# Patient Record
Sex: Male | Born: 1965 | Race: White | Hispanic: No | Marital: Married | Smoking: Never smoker
Health system: Southern US, Community
[De-identification: ages and names within clinical notes are randomized; demographics above are authoritative.]

---

## 2001-07-18 ENCOUNTER — Ambulatory Visit (HOSPITAL_BASED_OUTPATIENT_CLINIC_OR_DEPARTMENT_OTHER): Admission: RE | Admit: 2001-07-18 | Discharge: 2001-07-19 | Payer: Self-pay | Admitting: Orthopedic Surgery

## 2001-09-04 ENCOUNTER — Ambulatory Visit (HOSPITAL_BASED_OUTPATIENT_CLINIC_OR_DEPARTMENT_OTHER): Admission: RE | Admit: 2001-09-04 | Discharge: 2001-09-04 | Payer: Self-pay | Admitting: Orthopedic Surgery

## 2006-07-01 ENCOUNTER — Ambulatory Visit: Admission: RE | Admit: 2006-07-01 | Discharge: 2006-07-01 | Payer: Self-pay | Admitting: Family Medicine

## 2015-03-11 ENCOUNTER — Other Ambulatory Visit: Payer: Self-pay | Admitting: Family Medicine

## 2015-03-11 ENCOUNTER — Ambulatory Visit
Admission: RE | Admit: 2015-03-11 | Discharge: 2015-03-11 | Disposition: A | Discharge status: Home / Self Care | Payer: BLUE CROSS/BLUE SHIELD | Source: Ambulatory Visit | Attending: Family Medicine | Admitting: Family Medicine

## 2015-03-11 DIAGNOSIS — R059 Cough, unspecified: Secondary | ICD-10-CM

## 2015-03-11 DIAGNOSIS — R05 Cough: Secondary | ICD-10-CM

## 2015-08-13 ENCOUNTER — Emergency Department (HOSPITAL_COMMUNITY): Payer: BLUE CROSS/BLUE SHIELD

## 2015-08-13 ENCOUNTER — Encounter (HOSPITAL_COMMUNITY): Payer: Self-pay | Admitting: Family Medicine

## 2015-08-13 ENCOUNTER — Emergency Department (HOSPITAL_COMMUNITY)
Admission: EM | Admit: 2015-08-13 | Discharge: 2015-08-13 | Disposition: A | Discharge status: Home / Self Care | Payer: BLUE CROSS/BLUE SHIELD | Attending: Emergency Medicine | Admitting: Emergency Medicine

## 2015-08-13 DIAGNOSIS — F458 Other somatoform disorders: Secondary | ICD-10-CM | POA: Diagnosis not present

## 2015-08-13 DIAGNOSIS — R110 Nausea: Secondary | ICD-10-CM | POA: Diagnosis not present

## 2015-08-13 DIAGNOSIS — R0602 Shortness of breath: Secondary | ICD-10-CM | POA: Insufficient documentation

## 2015-08-13 DIAGNOSIS — F329 Major depressive disorder, single episode, unspecified: Secondary | ICD-10-CM | POA: Diagnosis not present

## 2015-08-13 DIAGNOSIS — R0989 Other specified symptoms and signs involving the circulatory and respiratory systems: Secondary | ICD-10-CM

## 2015-08-13 DIAGNOSIS — Z79899 Other long term (current) drug therapy: Secondary | ICD-10-CM | POA: Diagnosis not present

## 2015-08-13 DIAGNOSIS — R0789 Other chest pain: Secondary | ICD-10-CM | POA: Insufficient documentation

## 2015-08-13 DIAGNOSIS — R61 Generalized hyperhidrosis: Secondary | ICD-10-CM | POA: Insufficient documentation

## 2015-08-13 DIAGNOSIS — R079 Chest pain, unspecified: Secondary | ICD-10-CM | POA: Diagnosis present

## 2015-08-13 LAB — LIPASE, BLOOD: LIPASE: 28 U/L (ref 22–51)

## 2015-08-13 LAB — BASIC METABOLIC PANEL
Anion gap: 10 (ref 5–15)
BUN: 15 mg/dL (ref 6–20)
CHLORIDE: 101 mmol/L (ref 101–111)
CO2: 26 mmol/L (ref 22–32)
Calcium: 9.5 mg/dL (ref 8.9–10.3)
Creatinine, Ser: 0.97 mg/dL (ref 0.61–1.24)
GFR calc Af Amer: >60 mL/min (ref >60)
GFR calc non Af Amer: >60 mL/min (ref >60)
Glucose, Bld: 119 mg/dL — ABNORMAL HIGH (ref 65–99)
POTASSIUM: 4 mmol/L (ref 3.5–5.1)
SODIUM: 137 mmol/L (ref 135–145)

## 2015-08-13 LAB — COMPREHENSIVE METABOLIC PANEL
ALBUMIN: 4 g/dL (ref 3.5–5.0)
ALT: 44 U/L (ref 17–63)
AST: 31 U/L (ref 15–41)
Alkaline Phosphatase: 58 U/L (ref 38–126)
Anion gap: 9 (ref 5–15)
BILIRUBIN TOTAL: 0.6 mg/dL (ref 0.3–1.2)
BUN: 13 mg/dL (ref 6–20)
CHLORIDE: 101 mmol/L (ref 101–111)
CO2: 27 mmol/L (ref 22–32)
Calcium: 9.3 mg/dL (ref 8.9–10.3)
Creatinine, Ser: 1 mg/dL (ref 0.61–1.24)
GFR calc Af Amer: >60 mL/min (ref >60)
GFR calc non Af Amer: >60 mL/min (ref >60)
GLUCOSE: 92 mg/dL (ref 65–99)
POTASSIUM: 3.8 mmol/L (ref 3.5–5.1)
Sodium: 137 mmol/L (ref 135–145)
Total Protein: 6.9 g/dL (ref 6.5–8.1)

## 2015-08-13 LAB — CBC
HEMATOCRIT: 46 % (ref 39.0–52.0)
Hemoglobin: 16 g/dL (ref 13.0–17.0)
MCH: 31.3 pg (ref 26.0–34.0)
MCHC: 34.8 g/dL (ref 30.0–36.0)
MCV: 90 fL (ref 78.0–100.0)
Platelets: 246 10*3/uL (ref 150–400)
RBC: 5.11 MIL/uL (ref 4.22–5.81)
RDW: 12.6 % (ref 11.5–15.5)
WBC: 13.6 10*3/uL — AB (ref 4.0–10.5)

## 2015-08-13 LAB — I-STAT TROPONIN, ED: Troponin i, poc: 0 ng/mL (ref 0.00–0.08)

## 2015-08-13 LAB — TROPONIN I: TROPONIN I: 0.03 ng/mL (ref <0.031)

## 2015-08-13 MED ORDER — OMEPRAZOLE 20 MG PO CPDR: 20.0000 mg | DELAYED_RELEASE_CAPSULE | Freq: Every day | ORAL | Status: DC

## 2015-08-13 MED ORDER — GI COCKTAIL ~~LOC~~
30.0000 mL | Freq: Once | ORAL | Status: AC
  Administered 2015-08-13: 30 mL via ORAL
  Filled 2015-08-13: qty 30

## 2015-08-13 NOTE — Discharge Instructions (Signed)

## 2015-08-13 NOTE — ED Notes (Signed)
Pt. Left with all belongings and refused wheelchair. Discharge instructions were reviewed and all questions were answered.  

## 2015-08-13 NOTE — ED Notes (Signed)
Pt here for tightness in his chest, the feeling of something being stuck in throat, fatigue. sts started yesterday. sts he started a new depression med yesterday.

## 2015-08-13 NOTE — ED Notes (Signed)
MD at bedside. 

## 2015-08-13 NOTE — ED Provider Notes (Signed)
CSN: [id number]     Arrival date & time 08/13/15  1416 History   None    Chief Complaint  Patient presents with  . Chest Pain   Patient is a 49 y.o. male presenting with general illness. The history is provided by the patient. No language interpreter was used.  Illness Location:  Chest Quality:  Tightness Severity:  Moderate Onset quality:  Gradual Timing:  Intermittent Progression:  Unchanged Chronicity:  New Context:  PMHx of depression (not medicated) presenting with chest tightness. Ate brisket and egg biscuit for breakfast yesterday. Gradual onset chest tightness midmorning. Described as sensation of something in throat. Nonradiating. Initially endorsed worsening with exertion but then stated he noted the pain more at rest. Also worse with lying down and with eating. Associated with mild shortness of breath, nausea but no emesis, and diaphoresis. Denies previous history of the same. FHx of MI in 40's.  Associated symptoms: chest pain, nausea and shortness of breath   Associated symptoms: no abdominal pain, no cough, no diarrhea, no fever and no vomiting     History reviewed. No pertinent past medical history. History reviewed. No pertinent past surgical history. History reviewed. No pertinent family history. Social History  Substance Use Topics  . Smoking status: Never Smoker   . Smokeless tobacco: None  . Alcohol Use: Yes    Review of Systems  Constitutional: Positive for diaphoresis. Negative for fever and chills.  Respiratory: Positive for shortness of breath. Negative for cough.   Cardiovascular: Positive for chest pain.  Gastrointestinal: Positive for nausea. Negative for vomiting, abdominal pain, diarrhea and constipation.  Genitourinary: Negative for dysuria, urgency, frequency and hematuria.  All other systems reviewed and are negative.   Allergies  Review of patient's allergies indicates no known allergies.  Home Medications   Prior to Admission medications    Medication Sig Start Date End Date Taking? Authorizing Provider  atorvastatin (LIPITOR) 40 MG tablet Take 40 mg by mouth daily.   Yes Historical Provider, MD  HYDROcodone-acetaminophen (NORCO/VICODIN) 5-325 MG per tablet Take 1 tablet by mouth every 6 (six) hours as needed for moderate pain.   Yes Historical Provider, MD  methylphenidate 36 MG PO CR tablet Take 36 mg by mouth every morning. 08/01/15  Yes Historical Provider, MD  Vortioxetine HBr (TRINTELLIX) 5 MG TABS Take 5 mg by mouth daily.   Yes Historical Provider, MD  omeprazole (PRILOSEC) 20 MG capsule Take 1 capsule (20 mg total) by mouth daily. 08/13/15   Angelina Ok, MD   BP 117/63 mmHg  Pulse 68  Temp(Src) 98.3 F (36.8 C)  Resp 14  Ht 6' (1.829 m)  Wt 182 lb (82.555 kg)  BMI 24.68 kg/m2  SpO2 96%   Physical Exam  Constitutional: He is oriented to person, place, and time. He appears well-developed and well-nourished. No distress.  HENT:  Head: Normocephalic and atraumatic.  Mouth/Throat: No oropharyngeal exudate.  Eyes: Conjunctivae are normal. Pupils are equal, round, and reactive to light.  Neck: Normal range of motion. Neck supple. No tracheal deviation present.  Cardiovascular: Normal rate, regular rhythm and intact distal pulses.   Pulmonary/Chest: Effort normal and breath sounds normal.  Abdominal: Soft. Bowel sounds are normal.  Musculoskeletal: Normal range of motion.  Neurological: He is alert and oriented to person, place, and time.  Skin: Skin is warm and dry. He is not diaphoretic.    ED Course  Procedures   Labs Review Labs Reviewed  BASIC METABOLIC PANEL - Abnormal; Notable for the  following:    Glucose, Bld 119 (*)    All other components within normal limits  CBC - Abnormal; Notable for the following:    WBC 13.6 (*)    All other components within normal limits  LIPASE, BLOOD  TROPONIN I  COMPREHENSIVE METABOLIC PANEL  I-STAT TROPOININ, ED   Imaging Review Dg Chest 2 View  08/13/2015    CLINICAL DATA:  Chest pain and cough for 1 day  EXAM: CHEST  2 VIEW  COMPARISON:  March 11, 2015  FINDINGS: Lungs are clear. Heart size and pulmonary vascularity are normal. No adenopathy. No pneumothorax. No focal bone lesion. Postoperative changes noted in the left humerus.  IMPRESSION: No edema or consolidation.   Electronically Signed   By: Bretta Bang III M.D.   On: 08/13/2015 15:22   I have personally reviewed and evaluated these images and lab results as part of my medical decision-making.   EKG Interpretation   Date/Time:  Wednesday August 13 2015 14:21:23 EDT Ventricular Rate:  99 PR Interval:  144 QRS Duration: 84 QT Interval:  326 QTC Calculation: 418 R Axis:   69 Text Interpretation:  Normal sinus rhythm with sinus arrhythmia Normal ECG  Confirmed by Rubin Payor  MD, Harrold Donath [phone]) on 08/13/2015 3:32:59 PM      MDM  Mr. Gladu is a 49 yo male w/ PMHx of depression (not medicated) presenting with chest tightness. Ate brisket and egg biscuit for breakfast yesterday. Gradual onset chest tightness midmorning. Described as sensation of something in throat. Nonradiating. Initially endorsed worsening with exertion but then stated he noted the pain more at rest. Also worse with lying down and with eating. Associated with mild shortness of breath, nausea but no emesis, and diaphoresis. Denies previous history of the same. FHx of MI in 40's.   Exam above notable for middle-age male lying in stretcher in no acute distress. Afebrile. Heart rate 90s. Normotensive. Not tachypneic. Breathing well on room air and maintaining saturations without supplemental oxygen. Abdomen benign. Oropharynx clear without evidence of edema or erythema or obvious foreign body. Neck supple with full range of motion.  Suspected GI pathology given globus sensation and motion to pain with eating but patient with cardiac risk factors. WBC 13.6. Hemoglobin 16. BMP unremarkable. Delta troponin (-). Two-view chest  x-ray showing no evidence of edema, consolidation, pneumothorax, or obvious mass lodged in throat.  Patient given GI cocktail with near resolution in pain. Given low risk per stratification and negative troponin patient was discharged home in stable condition. Patient will follow-up with his PCP for outpatient stress testing. Provided with her prescription for Prilosec. ED return precautions discussed. Patient understands and agrees with plan and has no further questions concerning this time.   Patient care discussed with and followed by my attending, Dr. Benjiman Core  Final diagnoses:  Chest pain, unspecified chest pain type  Globus sensation    Angelina Ok, MD 08/13/15 2956  Benjiman Core, MD 08/14/15 1332

## 2017-01-05 IMAGING — DX DG CHEST 2V
2 series · 2 of 2 positions shown · non-contrast
Comparison: March 11, 2015

CLINICAL DATA: Chest pain and cough for 1 day

EXAM:
CHEST  2 VIEW

[chest pa]
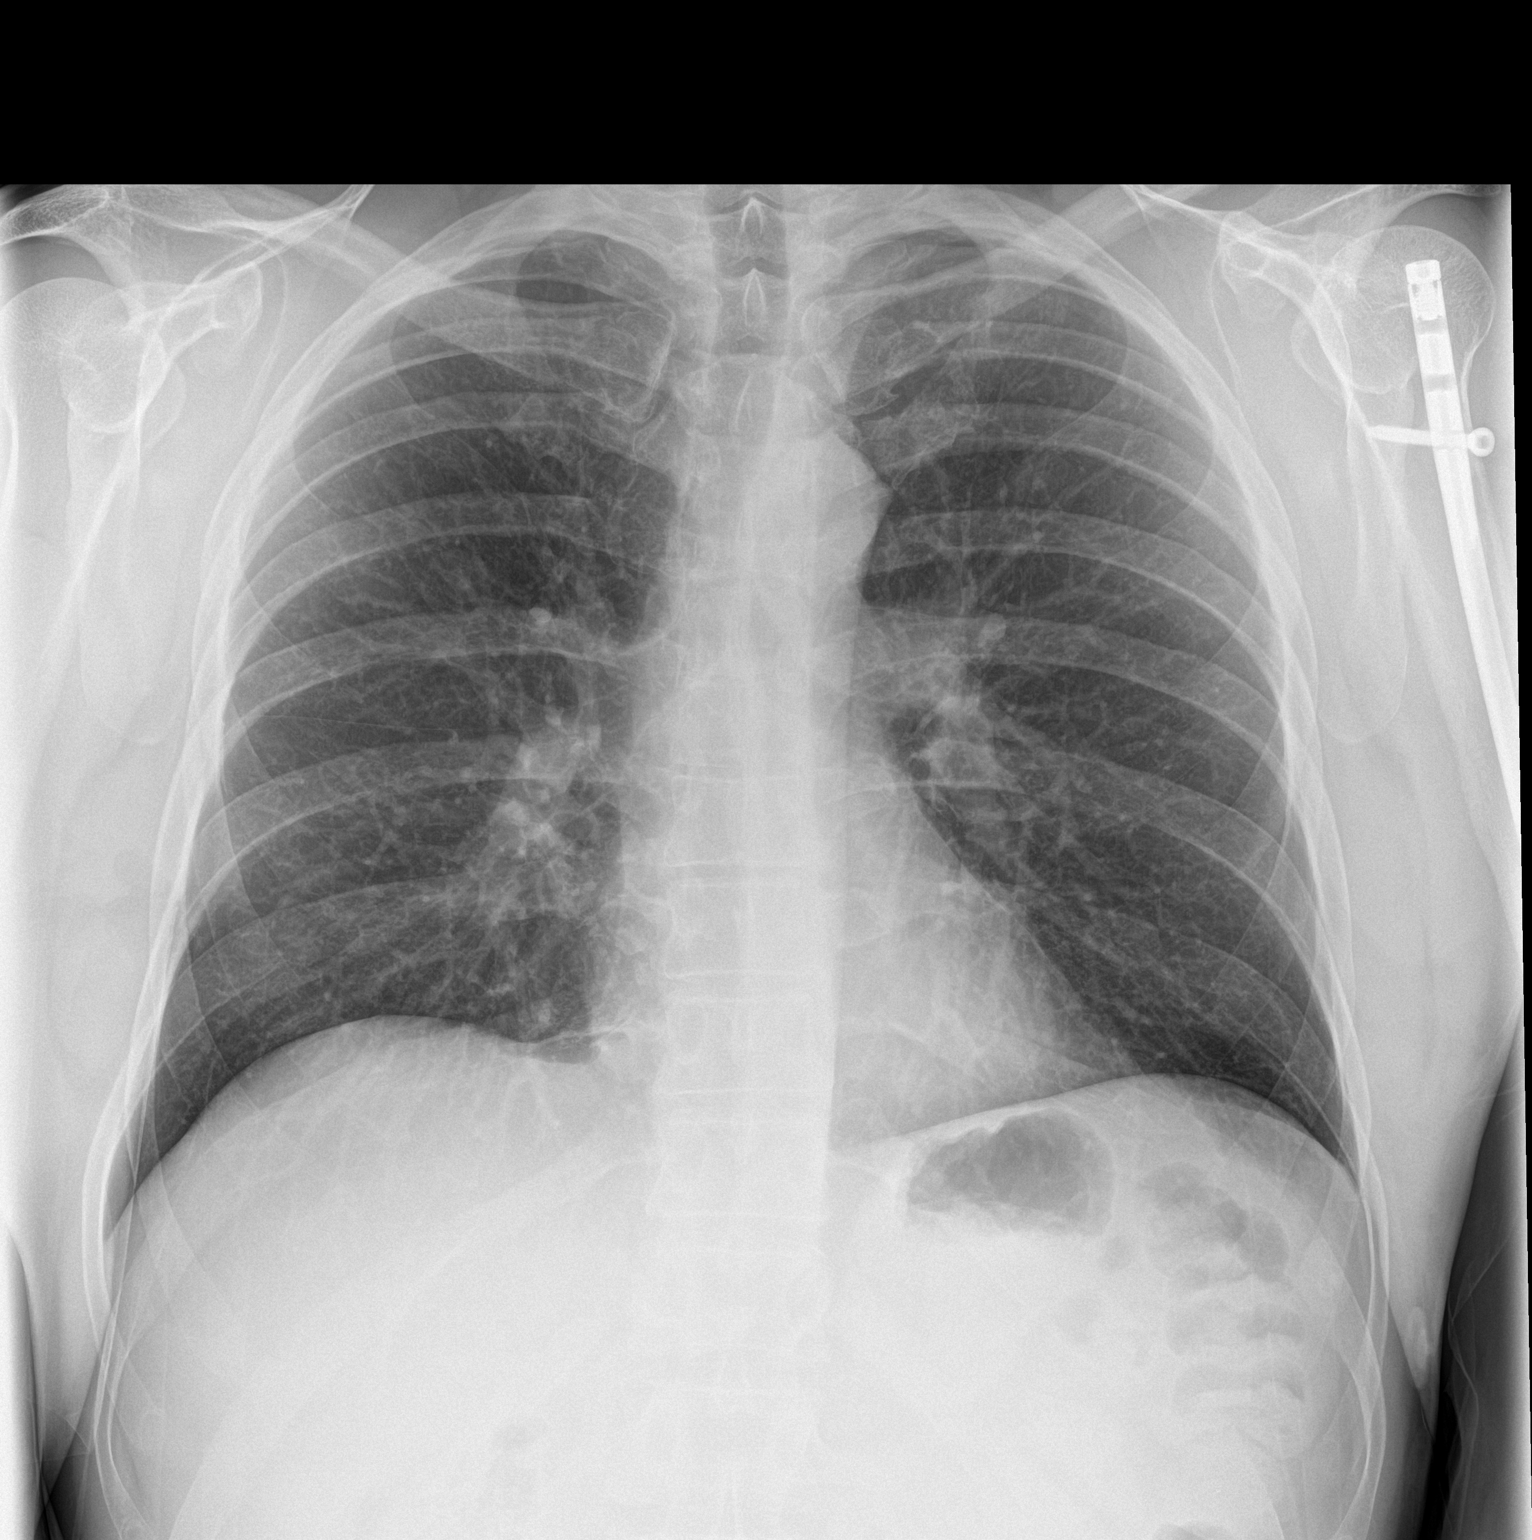

[chest lat]
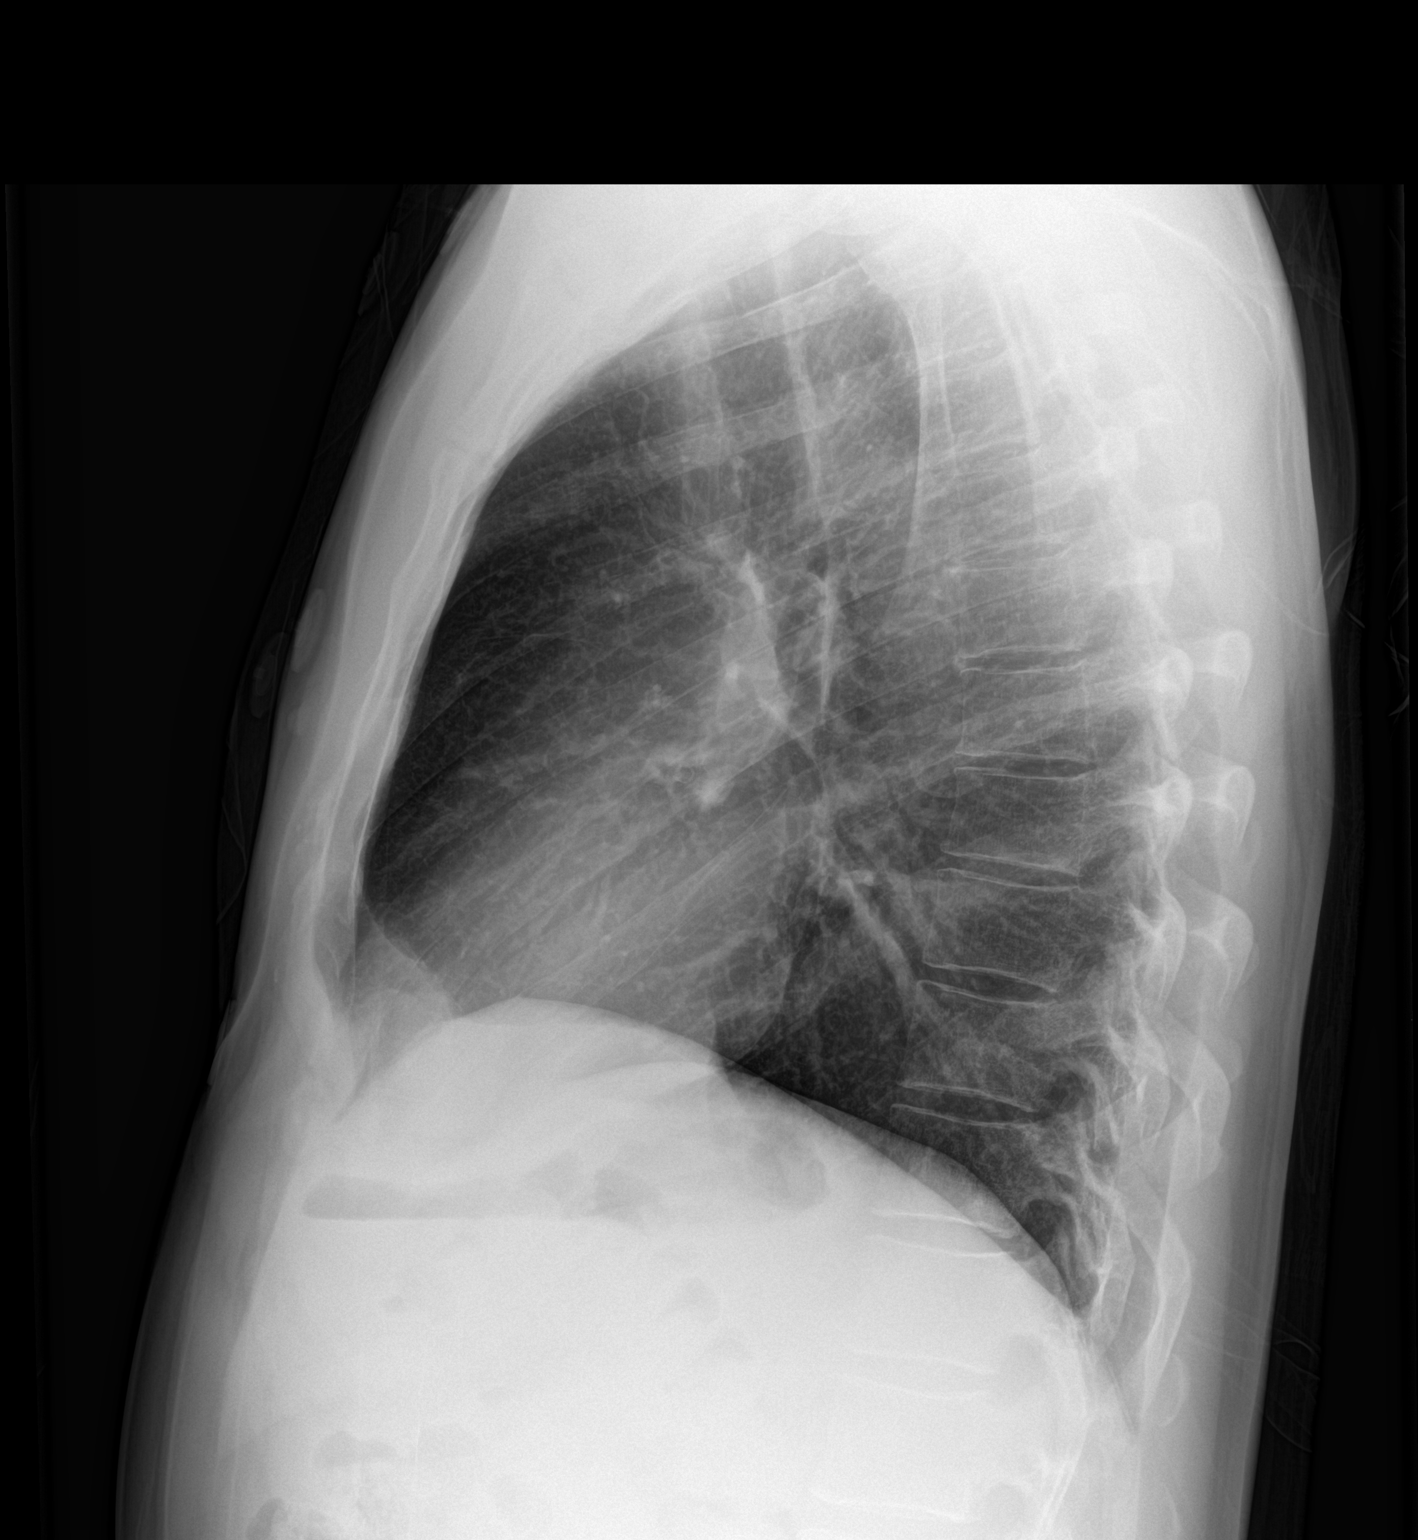

[2 of 2 positions shown; findings below may reference images not displayed]

FINDINGS: Lungs are clear. Heart size and pulmonary vascularity are normal. No
adenopathy. No pneumothorax. No focal bone lesion. Postoperative
changes noted in the left humerus.
IMPRESSION: No edema or consolidation.

## 2018-09-18 ENCOUNTER — Ambulatory Visit: Payer: Self-pay | Admitting: Psychiatry

## 2018-09-18 DIAGNOSIS — F902 Attention-deficit hyperactivity disorder, combined type: Secondary | ICD-10-CM

## 2018-09-18 DIAGNOSIS — R7989 Other specified abnormal findings of blood chemistry: Secondary | ICD-10-CM

## 2018-09-18 DIAGNOSIS — F331 Major depressive disorder, recurrent, moderate: Secondary | ICD-10-CM

## 2018-09-18 DIAGNOSIS — F411 Generalized anxiety disorder: Secondary | ICD-10-CM

## 2018-09-18 MED ORDER — ATOMOXETINE HCL 80 MG PO CAPS: 80.0000 mg | ORAL_CAPSULE | Freq: Every day | ORAL | 1 refills | Status: DC

## 2018-09-18 MED ORDER — ATOMOXETINE HCL 25 MG PO CAPS: ORAL_CAPSULE | ORAL | 0 refills | Status: DC

## 2018-09-18 NOTE — Progress Notes (Signed)
[facility]  Patient ID: Bruce Perez,  MRN: [id number]  PCP: System, Provider Not In  Date of Evaluation: 09/18/2018 Time spent: 40 minutes   HISTORY/CURRENT STATUS: HPI CC as noted Tried provigil for attention and low energy.  Helped a little, some se dizzy.  Asked about Strattera. Concerta less effective than hydrocodone for focus.  Can nap on Provigil.  So overwhelmed in every aspect of life.  Has been irritable felt like being rude to clients but has managed most of the time.  His productivity at work is impaired.  He is feeling more depressed.  Sleep variable not good lately.  A lot of work and rel stress.  Struggling with negative thoughts.  More social anxiety.  Isolating.  Trouble getting things done.  Moved appt up earlier.    Individual Medical History/ Review of Systems: Changes? :Yes Bronchitis 8 weeks until 2 weeks ago.  Some dizzy spells.  Hydromet syrup gave him "laser focus" and productivity.  Also interfered with sleep.  Colonoscopy and prostate exam within last year.  No PSA.  Allergies: Patient has no known allergies.  Current Medications:  Current Outpatient Medications:  .  atorvastatin (LIPITOR) 40 MG tablet, Take 40 mg by mouth daily., Disp: , Rfl:  .  HYDROcodone-acetaminophen (NORCO/VICODIN) 5-325 MG per tablet, Take 1 tablet by mouth every 6 (six) hours as needed for moderate pain., Disp: , Rfl:  .  modafinil (PROVIGIL) 200 MG tablet, Take 200 mg by mouth daily., Disp: , Rfl:  .  methylphenidate 36 MG PO CR tablet, Take 36 mg by mouth every morning., Disp: , Rfl: 0 .  omeprazole (PRILOSEC) 20 MG capsule, Take 1 capsule (20 mg total) by mouth daily. (Patient not taking: Reported on 09/18/2018), Disp: 14 capsule, Rfl: 0 Medication Side Effects: None, unless dizzy.  Family Medical/ Social History: Changes? Yes recent business trips with work.  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 PHD  Mentioned strattera.   Caffeine: 2  cups. Alcohol: 3-4/day.  MENTAL HEALTH EXAM:  There were no vitals taken for this visit.There is no height or weight on file to calculate BMI.  General Appearance: Meticulous  Eye Contact:  Good  Speech:  Normal Rate and talkative  Volume:  Normal  Mood:  Anxious, Depressed and Dysphoric, irritable daily.  Affect:  Appropriate  Thought Process:  Goal Directed  Orientation:  Full (Time, Place, and Person)  Thought Content: WDL   Suicidal Thoughts:  No  Homicidal Thoughts:  No  Memory:  Recent  Judgement:  Fair  Insight:  Fair  Psychomotor Activity:  Normal  Concentration:  Concentration: Good  Recall:  Good  Fund of Knowledge: Good  Language: Good  Akathisia:  No  AIMS (if indicated): not done  Assets:  Communication Skills Desire for Improvement Financial Resources/Insurance Housing Vocational/Educational  ADL's:  Intact  Cognition: WNL  Prognosis:  Fair    DIAGNOSES:    ICD-10-CM   1. Major depressive disorder, recurrent episode, moderate (HCC) F33.1   2. Attention deficit hyperactivity disorder (ADHD), combined type F90.2   3. Generalized anxiety disorder F41.1     RECOMMENDATIONS:  Greater than 50% of face to face time with patient was spent on counseling and coordination of care. We discussed stress affecting focus and vice versa.  His ADD has not been responsive to typical stimulants whether methylphenidate or amphetamine based.  Likewise with Provigil.  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  They also affect his relationships and outside life.  He is having some increase in depression as a result of the combination of these factors.  He asked about trying Strattera.  We just discussed possible medical factors that could contribute to lack of response including low vitamin D and low testosterone.  He is never been checked for either of these.  Therefore we will check these levels.  Discussed if he does have  low testosterone that if he also has latent prostate cancer the testosterone would cause or could cause the cancer to grow.  Because the functional impairment he is interested in the test and testosterone supplementation if indicated.  Also discussed cardiovascular risk..  Discussed alternatives with different mechanisms of action for attention and focus.  Strattera, off-label lithium low dose, Namenda, Aricept.  He would like to pursue the least mechanism or number of mechanisms and number of medicines possible so we will start with Strattera at 25 mg and increase every 5-7 days to 75 mg with a refill of 80 mg.  Follow-up 6 weeks     Lauraine Rinne, MD

## 2018-10-22 ENCOUNTER — Encounter: Payer: Self-pay | Admitting: Emergency Medicine

## 2018-10-22 DIAGNOSIS — F418 Other specified anxiety disorders: Secondary | ICD-10-CM | POA: Insufficient documentation

## 2018-10-22 DIAGNOSIS — F909 Attention-deficit hyperactivity disorder, unspecified type: Secondary | ICD-10-CM

## 2018-11-07 ENCOUNTER — Ambulatory Visit: Payer: Self-pay | Admitting: Psychiatry

## 2018-11-16 ENCOUNTER — Encounter: Payer: Self-pay | Admitting: Psychiatry

## 2018-11-16 ENCOUNTER — Ambulatory Visit: Payer: Self-pay | Admitting: Psychiatry

## 2018-11-16 ENCOUNTER — Other Ambulatory Visit: Payer: Self-pay

## 2018-12-15 ENCOUNTER — Telehealth: Payer: Self-pay | Admitting: Psychiatry

## 2018-12-15 NOTE — Telephone Encounter (Signed)
Needs rx for Atomoxetine 80mg  sent in to [facility].

## 2018-12-15 NOTE — Telephone Encounter (Signed)
Pt has cancelled his past 2 office visits with provider, needs to schedule appt.

## 2018-12-18 ENCOUNTER — Other Ambulatory Visit: Payer: Self-pay

## 2018-12-18 MED ORDER — ATOMOXETINE HCL 25 MG PO CAPS: ORAL_CAPSULE | ORAL | 0 refills | Status: DC

## 2018-12-18 NOTE — Telephone Encounter (Signed)
Sent Rx to [facility]

## 2018-12-18 NOTE — Telephone Encounter (Signed)
Can he get rX filled now. He made appt for Jan 16. See previous 1/10 Tel Call

## 2018-12-21 ENCOUNTER — Encounter: Payer: Self-pay | Admitting: Psychiatry

## 2018-12-21 NOTE — Progress Notes (Signed)
Previous Psych Trials include: Wellbutrin, Zoloft, Fluoxetine, Buspar, Trintellix, Ambien and Vyvanse.

## 2018-12-28 NOTE — Progress Notes (Signed)
Patient checked in but then needed to leave bc of the wait time. Meredith Staggers, MD, DFAPA

## 2019-01-12 NOTE — Progress Notes (Signed)
This encounter was created in error - please disregard.

## 2019-02-05 ENCOUNTER — Other Ambulatory Visit: Payer: Self-pay

## 2019-02-05 ENCOUNTER — Telehealth: Payer: Self-pay | Admitting: Psychiatry

## 2019-02-05 MED ORDER — ATOMOXETINE HCL 80 MG PO CAPS: 80.0000 mg | ORAL_CAPSULE | Freq: Every day | ORAL | 0 refills | Status: DC

## 2019-02-05 NOTE — Telephone Encounter (Signed)
Bruce Perez called to request a refill on his Strattera.  He is out today.  Send to [facility].  No appt but was seen 12/21/18

## 2019-02-05 NOTE — Telephone Encounter (Signed)
Pt overdue for follow up with provider, came in January but didn't stay for office visit. Needs scheduled.

## 2019-02-26 ENCOUNTER — Other Ambulatory Visit: Payer: Self-pay | Admitting: Psychiatry

## 2019-02-26 ENCOUNTER — Telehealth: Payer: Self-pay | Admitting: Psychiatry

## 2019-02-26 MED ORDER — ATOMOXETINE HCL 80 MG PO CAPS: 80.0000 mg | ORAL_CAPSULE | Freq: Every day | ORAL | 0 refills | Status: DC

## 2019-02-26 NOTE — Telephone Encounter (Signed)
Bruce Perez called to request refill of his Strattera 80 mg.  Please sent to [facility] in [place].  No appt scheduled

## 2019-05-09 ENCOUNTER — Other Ambulatory Visit: Payer: Self-pay

## 2019-05-09 ENCOUNTER — Telehealth: Payer: Self-pay | Admitting: Psychiatry

## 2019-05-09 MED ORDER — ATOMOXETINE HCL 80 MG PO CAPS: 80.0000 mg | ORAL_CAPSULE | Freq: Every day | ORAL | 0 refills | Status: DC

## 2019-05-09 NOTE — Telephone Encounter (Signed)
Pt requesting refill for Strattera 80mg . Fill at the [facility]. Made appt for 7/30

## 2019-05-09 NOTE — Telephone Encounter (Signed)
Submitted per request 

## 2019-06-22 ENCOUNTER — Telehealth: Payer: Self-pay | Admitting: Psychiatry

## 2019-06-22 ENCOUNTER — Other Ambulatory Visit: Payer: Self-pay

## 2019-06-22 MED ORDER — ATOMOXETINE HCL 80 MG PO CAPS: 80.0000 mg | ORAL_CAPSULE | Freq: Every day | ORAL | 0 refills | Status: DC

## 2019-06-22 NOTE — Telephone Encounter (Signed)
Patient need refill on Straterra to be sent to [facility] on [address]. Or [facility] which ever is cheaper.  Patient has appt., 07/31

## 2019-06-22 NOTE — Telephone Encounter (Signed)
rx submitted per request.  Pt needs to keep scheduled appt for further refills

## 2019-07-06 ENCOUNTER — Ambulatory Visit: Payer: Self-pay | Admitting: Psychiatry

## 2019-07-09 ENCOUNTER — Encounter: Payer: Self-pay | Admitting: Psychiatry

## 2019-07-09 ENCOUNTER — Other Ambulatory Visit: Payer: Self-pay

## 2019-07-09 ENCOUNTER — Encounter

## 2019-07-09 ENCOUNTER — Ambulatory Visit (INDEPENDENT_AMBULATORY_CARE_PROVIDER_SITE_OTHER): Payer: Self-pay | Admitting: Psychiatry

## 2019-07-09 DIAGNOSIS — F902 Attention-deficit hyperactivity disorder, combined type: Secondary | ICD-10-CM

## 2019-07-09 DIAGNOSIS — R7989 Other specified abnormal findings of blood chemistry: Secondary | ICD-10-CM

## 2019-07-09 NOTE — Progress Notes (Signed)
Bruce JoyClarence H Stringfellow Perez [id number] 1966/03/28 53 y.o.  Subjective:   Patient ID:  Bruce Perez is a 53 y.o. (DOB 1966/03/28) male.  Chief Complaint:  Chief Complaint  Patient presents with  . Follow-up    Medication Management  . ADHD    Medication Management    HPI  Bruce JoyClarence H Statler Perez presents to the office today for follow-up of ADD and episodic mood problems.  Last seen September 14, 2018.  Started Strattera at the time.  Some dizziness with the Strattera and nausea.  Took it daily since here uhntil the last few days.  Still chronic stress.  Energy is better with ADD meds.  Only thing that makes me task focused is opiates and understands it's not an option  Easily distracted, makes mistakes and it hurts business.  Can be irritable.    Chronically stressed. And easily anxious.    Has girlfriend who's into holistic meds.    Past Psychiatric Medication Trials: Provigil side effects, Strattera, Concerta, Wellbutrin, sertraline, fluoxetine, Trintellix, zolpidem, buspirone, Propranolol, hydroxyzine, MPH,  Adderall, Vyvanse,   Review of Systems:  Review of Systems  Neurological: Negative for tremors and weakness.    Medications: I have reviewed the patient's current medications.  Current Outpatient Medications  Medication Sig Dispense Refill  . atorvastatin (LIPITOR) 40 MG tablet Take 40 mg by mouth daily.    Marland Kitchen. HYDROcodone-acetaminophen (NORCO/VICODIN) 5-325 MG per tablet Take 1 tablet by mouth every 6 (six) hours as needed for moderate pain.    Marland Kitchen. atomoxetine (STRATTERA) 80 MG capsule Take 1 capsule (80 mg total) by mouth daily. (Patient not taking: Reported on 07/09/2019) 30 capsule 0   No current facility-administered medications for this visit.     Medication Side Effects: None  Allergies: No Known Allergies  History reviewed. No pertinent past medical history.  History reviewed. No pertinent family history.  Social History   Socioeconomic History   . Marital status: Single    Spouse name: Not on file  . Number of children: Not on file  . Years of education: Not on file  . Highest education level: Not on file  Occupational History  . Not on file  Social Needs  . Financial resource strain: Not on file  . Food insecurity    Worry: Not on file    Inability: Not on file  . Transportation needs    Medical: Not on file    Non-medical: Not on file  Tobacco Use  . Smoking status: Never Smoker  . Smokeless tobacco: Former Engineer, waterUser  Substance and Sexual Activity  . Alcohol use: Yes  . Drug use: No  . Sexual activity: Not on file  Lifestyle  . Physical activity    Days per week: Not on file    Minutes per session: Not on file  . Stress: Not on file  Relationships  . Social Musicianconnections    Talks on phone: Not on file    Gets together: Not on file    Attends religious service: Not on file    Active member of club or organization: Not on file    Attends meetings of clubs or organizations: Not on file    Relationship status: Not on file  . Intimate partner violence    Fear of current or ex partner: Not on file    Emotionally abused: Not on file    Physically abused: Not on file    Forced sexual activity: Not on file  Other Topics Concern  .  Not on file  Social History Narrative  . Not on file    Past Medical History, Surgical history, Social history, and Family history were reviewed and updated as appropriate.   Please see review of systems for further details on the patient's review from today.   Objective:   Physical Exam:  There were no vitals taken for this visit.  Physical Exam Constitutional:      General: He is not in acute distress.    Appearance: He is well-developed.  Musculoskeletal:        General: No deformity.  Neurological:     Mental Status: He is alert and oriented to person, place, and time.     Coordination: Coordination normal.  Psychiatric:        Attention and Perception: Perception normal. He  is inattentive. He does not perceive auditory or visual hallucinations.        Mood and Affect: Mood normal. Mood is not anxious or depressed. Affect is not labile, blunt, angry or inappropriate.        Speech: Speech normal.        Behavior: Behavior normal.        Thought Content: Thought content normal. Thought content is not paranoid or delusional. Thought content does not include homicidal or suicidal ideation. Thought content does not include homicidal or suicidal plan.        Cognition and Memory: Cognition and memory normal.        Judgment: Judgment normal.     Comments: Insight intact     Lab Review:     Component Value Date/Time   NA 137 08/13/2015 1833   K 3.8 08/13/2015 1833   CL 101 08/13/2015 1833   CO2 27 08/13/2015 1833   GLUCOSE 92 08/13/2015 1833   BUN 13 08/13/2015 1833   CREATININE 1.00 08/13/2015 1833   CALCIUM 9.3 08/13/2015 1833   PROT 6.9 08/13/2015 1833   ALBUMIN 4.0 08/13/2015 1833   AST 31 08/13/2015 1833   ALT 44 08/13/2015 1833   ALKPHOS 58 08/13/2015 1833   BILITOT 0.6 08/13/2015 1833   GFRNONAA >60 08/13/2015 1833   GFRAA >60 08/13/2015 1833       Component Value Date/Time   WBC 13.6 (H) 08/13/2015 1425   RBC 5.11 08/13/2015 1425   HGB 16.0 08/13/2015 1425   HCT 46.0 08/13/2015 1425   PLT 246 08/13/2015 1425   MCV 90.0 08/13/2015 1425   MCH 31.3 08/13/2015 1425   MCHC 34.8 08/13/2015 1425   RDW 12.6 08/13/2015 1425    No results found for: POCLITH, LITHIUM   No results found for: PHENYTOIN, PHENOBARB, VALPROATE, CBMZ   .res Assessment: Plan:    Derius was seen today for follow-up and adhd.  Diagnoses and all orders for this visit:  Attention deficit hyperactivity disorder (ADHD), combined type  Low testosterone in male   Disc sig risk dependence on opiates and especially if he's predisposed genetically to addiction with them bc he feels so much better with opiates.  Option Rachel Moulds, MD consultation for [place] resistant  ADHD.  N-Acetylcysteine (NAC) at [phone] mg daily to help with mild cognitive problems like ADHD .  It can be combined with a B-complex vitamin as the B-12 and folate have been shown to sometimes enhance the effect.  Disc Vitamin D and B possibly also.  Option retry Kris Hartmann.  He doesn't want to start new psych meds.  Disc behavioral management techniques to manage ADHD, structure, less  distracting environment.  FU prn  Meredith Staggersarey Cottle, MD, DFAPA   Please see After Visit Summary for patient specific instructions.  No future appointments.  No orders of the defined types were placed in this encounter.   -------------------------------

## 2019-07-09 NOTE — Patient Instructions (Addendum)
N-Acetylcysteine (NAC) at [phone] mg daily to help with mild cognitive problems like ADHD [email or web address]  It can be combined with a B-complex vitamin as the B-12 and folate have been shown to sometimes enhance the effect.  Also vitamin D at least 4000 units daily for cognitive and potential Covid protection.  Rachel Moulds, MD  ADD expert in [place].

## 2019-08-30 ENCOUNTER — Encounter: Payer: Self-pay | Admitting: Psychiatry

## 2019-08-30 ENCOUNTER — Ambulatory Visit (INDEPENDENT_AMBULATORY_CARE_PROVIDER_SITE_OTHER): Payer: Self-pay | Admitting: Psychiatry

## 2019-08-30 ENCOUNTER — Other Ambulatory Visit: Payer: Self-pay

## 2019-08-30 DIAGNOSIS — R7989 Other specified abnormal findings of blood chemistry: Secondary | ICD-10-CM

## 2019-08-30 DIAGNOSIS — F902 Attention-deficit hyperactivity disorder, combined type: Secondary | ICD-10-CM

## 2019-08-30 DIAGNOSIS — R5382 Chronic fatigue, unspecified: Secondary | ICD-10-CM

## 2019-08-30 DIAGNOSIS — R6882 Decreased libido: Secondary | ICD-10-CM

## 2019-08-30 DIAGNOSIS — F411 Generalized anxiety disorder: Secondary | ICD-10-CM

## 2019-08-30 DIAGNOSIS — F331 Major depressive disorder, recurrent, moderate: Secondary | ICD-10-CM

## 2019-08-30 MED ORDER — AMPHETAMINE-DEXTROAMPHETAMINE 10 MG PO TABS: ORAL_TABLET | ORAL | 0 refills | Status: DC

## 2019-08-30 NOTE — Progress Notes (Signed)
Bruce Perez [id number] 1965/12/12 53 y.o.  Subjective:   Patient ID:  Bruce Perez is a 53 y.o. (DOB 02-Feb-1966) male.  Chief Complaint:  Chief Complaint  Patient presents with  . Follow-up    Medication Management  . ADHD    Medication Management  . Depression    Medication Management    HPI  Bruce Perez presents to the office today for follow-up of ADD and episodic mood problems.  Last seen July 09, 2019.  He was still having cognitive complaints.  He did not want to try another psych med but was interested in trying off label N-acetylcysteine possibly combined with a B complex vitamin for its cognitive potential benefit.  Tried NAC and B complex.  No benefit for energy.  Wants to retry stimulant.  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  Low libido. Wants to test testosterone level.   GF goes to integrative health and asks about alternative med supplements.    Some dizziness with the Strattera and nausea.  Took it daily since here uhntil the last few days.  Still chronic stress.  Energy is better with ADD meds.  Only thing that makes me task focused is opiates and understands it's not an option  Easily distracted, makes mistakes and it hurts business.  Can be irritable.    Chronically stressed. And easily anxious.    Has girlfriend who's into holistic meds.   Past Psychiatric Medication Trials: Provigil side effects, Strattera SE, Concerta, Wellbutrin, sertraline, fluoxetine, Trintellix, zolpidem, buspirone, Propranolol, hydroxyzine, MPH,  Adderall, Vyvanse, modafinil  Review of Systems:  Review of Systems  Constitutional: Positive for fatigue.  Neurological: Negative for tremors and weakness.  Psychiatric/Behavioral: Positive for depression.    Medications: I have reviewed the patient's current medications.  Current Outpatient Medications  Medication Sig Dispense Refill  . Acetylcysteine (NAC) 500 MG CAPS Take  by mouth.    Marland Kitchen atorvastatin (LIPITOR) 40 MG tablet Take 40 mg by mouth daily.    . cholecalciferol (VITAMIN D3) 25 MCG (1000 UT) tablet Take 1,000 Units by mouth daily.    . magnesium 30 MG tablet Take 30 mg by mouth daily.    . Omega-3 Fatty Acids (FISH OIL) 1000 MG CAPS Take by mouth.    Marland Kitchen amphetamine-dextroamphetamine (ADDERALL) 10 MG tablet 1/2 tablet twice for 1 week, then 1 twice daily 60 tablet 0   No current facility-administered medications for this visit.     Medication Side Effects: None  Allergies: No Known Allergies  History reviewed. No pertinent past medical history.  History reviewed. No pertinent family history.  Social History   Socioeconomic History  . Marital status: Single    Spouse name: Not on file  . Number of children: Not on file  . Years of education: Not on file  . Highest education level: Not on file  Occupational History  . Not on file  Social Needs  . Financial resource strain: Not on file  . Food insecurity    Worry: Not on file    Inability: Not on file  . Transportation needs    Medical: Not on file    Non-medical: Not on file  Tobacco Use  . Smoking status: Never Smoker  . Smokeless tobacco: Former Network engineer and Sexual Activity  . Alcohol use: Yes  . Drug use: No  . Sexual activity: Not on file  Lifestyle  . Physical activity    Days per week: Not  on file    Minutes per session: Not on file  . Stress: Not on file  Relationships  . Social Musicianconnections    Talks on phone: Not on file    Gets together: Not on file    Attends religious service: Not on file    Active member of club or organization: Not on file    Attends meetings of clubs or organizations: Not on file    Relationship status: Not on file  . Intimate partner violence    Fear of current or ex partner: Not on file    Emotionally abused: Not on file    Physically abused: Not on file    Forced sexual activity: Not on file  Other Topics Concern  . Not on file   Social History Narrative  . Not on file    Past Medical History, Surgical history, Social history, and Family history were reviewed and updated as appropriate.   Please see review of systems for further details on the patient's review from today.   Objective:   Physical Exam:  There were no vitals taken for this visit.  Physical Exam Neurological:     Mental Status: He is alert and oriented to person, place, and time.     Cranial Nerves: No dysarthria.  Psychiatric:        Attention and Perception: He is inattentive.        Mood and Affect: Mood normal.        Speech: Speech normal.        Behavior: Behavior is cooperative.        Thought Content: Thought content normal. Thought content is not paranoid or delusional. Thought content does not include homicidal or suicidal ideation. Thought content does not include homicidal or suicidal plan.        Cognition and Memory: Cognition and memory normal.        Judgment: Judgment normal.     Comments: Insight fair     Lab Review:     Component Value Date/Time   NA 137 08/13/2015 1833   K 3.8 08/13/2015 1833   CL 101 08/13/2015 1833   CO2 27 08/13/2015 1833   GLUCOSE 92 08/13/2015 1833   BUN 13 08/13/2015 1833   CREATININE 1.00 08/13/2015 1833   CALCIUM 9.3 08/13/2015 1833   PROT 6.9 08/13/2015 1833   ALBUMIN 4.0 08/13/2015 1833   AST 31 08/13/2015 1833   ALT 44 08/13/2015 1833   ALKPHOS 58 08/13/2015 1833   BILITOT 0.6 08/13/2015 1833   GFRNONAA >60 08/13/2015 1833   GFRAA >60 08/13/2015 1833       Component Value Date/Time   WBC 13.6 (H) 08/13/2015 1425   RBC 5.11 08/13/2015 1425   HGB 16.0 08/13/2015 1425   HCT 46.0 08/13/2015 1425   PLT 246 08/13/2015 1425   MCV 90.0 08/13/2015 1425   MCH 31.3 08/13/2015 1425   MCHC 34.8 08/13/2015 1425   RDW 12.6 08/13/2015 1425    No results found for: POCLITH, LITHIUM   No results found for: PHENYTOIN, PHENOBARB, VALPROATE, CBMZ   .res Assessment: Plan:     Bruce Perez was seen today for follow-up, adhd and depression.  Diagnoses and all orders for this visit:  Attention deficit hyperactivity disorder (ADHD), combined type -     amphetamine-dextroamphetamine (ADDERALL) 10 MG tablet; 1/2 tablet twice for 1 week, then 1 twice daily  Low libido -     Testosterone, Free, Direct  Major depressive disorder, recurrent episode,  moderate (HCC) -     Testosterone, Free, Direct  Generalized anxiety disorder  Low vitamin D level  Chronic fatigue -     Testosterone, Free, Direct     Disc sig risk dependence on opiates and especially if he's predisposed genetically to addiction with them bc he feels so much better with opiates.   He is struggling with ADD affecting his work performance more than at the last visit including inattention and conversation as well as difficulty initiating and completing tasks. Option Marisue Brooklyn, MD consultation for [place] resistant ADHD.  Continue N-Acetylcysteine (NAC) at [phone] mg daily to help with mild cognitive problems like ADHD .  It can be combined with a B-complex vitamin as the B-12 and folate have been shown to sometimes enhance the effect.  He may gradually have more benefit with time and especially could have additional benefit when combined with a stimulant which she is interested in doing today.  Disc Vitamin D and B possibly also.  Option retry Clent Demark.  He wants to retry Adderall.  He took it only very briefly years ago during a period of very high stress and therefore is difficult to evaluate its effects.  He is med sensitive so we will start with a low-dose 5 mg twice daily and increase to 10 mg twice daily.  We can go higher if that is ineffective.  Disc behavioral management techniques to manage ADHD, structure, less distracting environment.  FU 6-8weeks  Meredith Staggers, MD, DFAPA   Please see After Visit Summary for patient specific instructions.  No future  appointments.  Orders Placed This Encounter  Procedures  . Testosterone, Free, Direct    -------------------------------

## 2019-10-03 ENCOUNTER — Other Ambulatory Visit: Payer: Self-pay

## 2019-10-03 ENCOUNTER — Telehealth: Payer: Self-pay | Admitting: Psychiatry

## 2019-10-03 DIAGNOSIS — F902 Attention-deficit hyperactivity disorder, combined type: Secondary | ICD-10-CM

## 2019-10-03 MED ORDER — AMPHETAMINE-DEXTROAMPHETAMINE 10 MG PO TABS: ORAL_TABLET | ORAL | 0 refills | Status: DC

## 2019-10-03 NOTE — Telephone Encounter (Signed)
Last refill 08/30/2019

## 2019-10-03 NOTE — Telephone Encounter (Signed)
Patient called and wants a refill on his adderall 10 mg to be sent [facility] at [facility] [place] at [address]

## 2019-10-08 ENCOUNTER — Other Ambulatory Visit: Payer: Self-pay

## 2019-10-08 DIAGNOSIS — Z20822 Contact with and (suspected) exposure to covid-19: Secondary | ICD-10-CM

## 2019-10-09 LAB — NOVEL CORONAVIRUS, NAA: SARS-CoV-2, NAA: NOT DETECTED

## 2019-11-08 ENCOUNTER — Telehealth: Payer: Self-pay | Admitting: Psychiatry

## 2019-11-08 ENCOUNTER — Other Ambulatory Visit: Payer: Self-pay

## 2019-11-08 DIAGNOSIS — F902 Attention-deficit hyperactivity disorder, combined type: Secondary | ICD-10-CM

## 2019-11-08 MED ORDER — AMPHETAMINE-DEXTROAMPHETAMINE 10 MG PO TABS: ORAL_TABLET | ORAL | 0 refills | Status: DC

## 2019-11-08 NOTE — Telephone Encounter (Signed)
Last refill 10/10/2019 Last apt 08/29/2019 due back 6-8 weeks for follow up Pended for approval

## 2019-11-08 NOTE — Telephone Encounter (Signed)
Pt would like a refill on Adderall 10mg . Send to [facility] on [address].

## 2019-11-08 NOTE — Telephone Encounter (Signed)
Call to reschedule

## 2019-12-17 ENCOUNTER — Telehealth: Payer: Self-pay | Admitting: Psychiatry

## 2019-12-17 ENCOUNTER — Other Ambulatory Visit: Payer: Self-pay

## 2019-12-17 DIAGNOSIS — F902 Attention-deficit hyperactivity disorder, combined type: Secondary | ICD-10-CM

## 2019-12-17 NOTE — Telephone Encounter (Signed)
Patient called and said that he needs a refill on his adderall sent into the [facility] on [address]

## 2019-12-17 NOTE — Telephone Encounter (Signed)
Call to RS.  Needs appt

## 2019-12-17 NOTE — Telephone Encounter (Signed)
Last refill 11/09/2019 Pended for Dr. Jennelle Human

## 2019-12-20 ENCOUNTER — Other Ambulatory Visit: Payer: Self-pay | Admitting: Psychiatry

## 2019-12-20 DIAGNOSIS — F902 Attention-deficit hyperactivity disorder, combined type: Secondary | ICD-10-CM

## 2019-12-20 MED ORDER — AMPHETAMINE-DEXTROAMPHETAMINE 10 MG PO TABS: ORAL_TABLET | ORAL | 0 refills | Status: DC

## 2019-12-20 NOTE — Telephone Encounter (Signed)
Pt requesting Adderall refill he stated to fill at the [facility]. He has scheduled an appt for 1/20. He will run out on tomorrow.

## 2019-12-26 ENCOUNTER — Ambulatory Visit: Payer: Self-pay | Admitting: Psychiatry

## 2020-02-05 ENCOUNTER — Other Ambulatory Visit: Payer: Self-pay

## 2020-02-05 ENCOUNTER — Encounter: Payer: Self-pay | Admitting: Psychiatry

## 2020-02-05 ENCOUNTER — Ambulatory Visit (INDEPENDENT_AMBULATORY_CARE_PROVIDER_SITE_OTHER): Payer: Self-pay | Admitting: Psychiatry

## 2020-02-05 DIAGNOSIS — F902 Attention-deficit hyperactivity disorder, combined type: Secondary | ICD-10-CM

## 2020-02-05 MED ORDER — AMPHETAMINE-DEXTROAMPHETAMINE 10 MG PO TABS: ORAL_TABLET | ORAL | 0 refills | Status: DC

## 2020-02-05 NOTE — Progress Notes (Signed)
Bruce Perez [id number] [birth year] 54 y.o.  Subjective:   Patient ID:  Bruce Perez Perez is a 54 y.o. (DOB 1966/06/16) male.  Chief Complaint:  Chief Complaint  Patient presents with  . ADHD  . Follow-up    Anxiety and mood and med change    Depression        Associated symptoms include no fatigue.   Bruce Perez presents to the office today for follow-up of ADD and episodic mood problems.  seen July 09, 2019.  He was still having cognitive complaints.  He did not want to try another psych med but was interested in trying off label N-acetylcysteine possibly combined with a B complex vitamin for its cognitive potential benefit.  Last seen August 30, 2019.  Want to retry stimulant.  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  Low libido. Wants to test testosterone level. He wants to retry Adderall.  He took it only very briefly years ago during a period of very high stress and therefore is difficult to evaluate its effects.  He is med sensitive so we will start with a low-dose 5 mg twice daily and increase to 10 mg twice daily.  We can go higher if that is ineffective.  He has received refills since that time.  Adderall helpful.  Immediately helped focus. Really good.  No SE problems.  Not more irritable nor edgy.  Mood good.  Going through breakup of 2 & 1/2 year relationship. Sleep is OK.  It lasts 6 hours.  GF goes to integrative health and asks about alternative med supplements.    Some dizziness with the Strattera and nausea.  Took it daily since here uhntil the last few days.  Still chronic stress.  Energy is better with ADD meds.  Only thing that makes me task focused is opiates and understands it's not an option  Easily distracted, makes mistakes and it hurts business.  Can be irritable.    Chronically stressed. And easily anxious.    Has girlfriend who's into holistic meds.   Past Psychiatric Medication Trials: Provigil side  effects, Strattera SE, Concerta, Wellbutrin, sertraline, fluoxetine, Trintellix, zolpidem, buspirone, Propranolol, hydroxyzine, MPH,  Adderall, Vyvanse, modafinil Tried NAC and B complex.  No benefit for energy.  Review of Systems:  Review of Systems  Constitutional: Negative for fatigue.  Skin: Positive for rash.  Neurological: Negative for tremors and weakness.  Psychiatric/Behavioral: Positive for depression.    Medications: I have reviewed the patient's current medications.  Current Outpatient Medications  Medication Sig Dispense Refill  . Acetylcysteine (NAC) 500 MG CAPS Take by mouth.    Marland Kitchen amphetamine-dextroamphetamine (ADDERALL) 10 MG tablet take 1 tablet twice daily.  Keep appointment with MD January 20 180 tablet 0  . atorvastatin (LIPITOR) 40 MG tablet Take 40 mg by mouth daily.    . cholecalciferol (VITAMIN D3) 25 MCG (1000 UT) tablet Take 1,000 Units by mouth daily.    . magnesium 30 MG tablet Take 30 mg by mouth daily.    . Omega-3 Fatty Acids (FISH OIL) 1000 MG CAPS Take by mouth.     No current facility-administered medications for this visit.    Medication Side Effects: None  Allergies: No Known Allergies  History reviewed. No pertinent past medical history.  History reviewed. No pertinent family history.  Social History   Socioeconomic History  . Marital status: Single    Spouse name: Not on file  . Number of children: Not on  file  . Years of education: Not on file  . Highest education level: Not on file  Occupational History  . Not on file  Tobacco Use  . Smoking status: Never Smoker  . Smokeless tobacco: Former Network engineer and Sexual Activity  . Alcohol use: Yes  . Drug use: No  . Sexual activity: Not on file  Other Topics Concern  . Not on file  Social History Narrative  . Not on file   Social Determinants of Health   Financial Resource Strain:   . Difficulty of Paying Living Expenses: Not on file  Food Insecurity:   . Worried About  Charity fundraiser in the Last Year: Not on file  . Ran Out of Food in the Last Year: Not on file  Transportation Needs:   . Lack of Transportation (Medical): Not on file  . Lack of Transportation (Non-Medical): Not on file  Physical Activity:   . Days of Exercise per Week: Not on file  . Minutes of Exercise per Session: Not on file  Stress:   . Feeling of Stress : Not on file  Social Connections:   . Frequency of Communication with Friends and Family: Not on file  . Frequency of Social Gatherings with Friends and Family: Not on file  . Attends Religious Services: Not on file  . Active Member of Clubs or Organizations: Not on file  . Attends Archivist Meetings: Not on file  . Marital Status: Not on file  Intimate Partner Violence:   . Fear of Current or Ex-Partner: Not on file  . Emotionally Abused: Not on file  . Physically Abused: Not on file  . Sexually Abused: Not on file    Past Medical History, Surgical history, Social history, and Family history were reviewed and updated as appropriate.   Please see review of systems for further details on the patient's review from today.   Objective:   Physical Exam:  There were no vitals taken for this visit.  Physical Exam Neurological:     Mental Status: He is alert and oriented to person, place, and time.     Cranial Nerves: No dysarthria.  Psychiatric:        Attention and Perception: He is attentive.        Mood and Affect: Mood normal.        Speech: Speech normal.        Behavior: Behavior is cooperative.        Thought Content: Thought content normal. Thought content is not paranoid or delusional. Thought content does not include homicidal or suicidal ideation. Thought content does not include homicidal or suicidal plan.        Cognition and Memory: Cognition and memory normal.        Judgment: Judgment normal.     Comments: Insight fair     Lab Review:     Component Value Date/Time   NA 137 08/13/2015  1833   K 3.8 08/13/2015 1833   CL 101 08/13/2015 1833   CO2 27 08/13/2015 1833   GLUCOSE 92 08/13/2015 1833   BUN 13 08/13/2015 1833   CREATININE 1.00 08/13/2015 1833   CALCIUM 9.3 08/13/2015 1833   PROT 6.9 08/13/2015 1833   ALBUMIN 4.0 08/13/2015 1833   AST 31 08/13/2015 1833   ALT 44 08/13/2015 1833   ALKPHOS 58 08/13/2015 1833   BILITOT 0.6 08/13/2015 1833   GFRNONAA >60 08/13/2015 1833   GFRAA >60 08/13/2015 1833  Component Value Date/Time   WBC 13.6 (H) 08/13/2015 1425   RBC 5.11 08/13/2015 1425   HGB 16.0 08/13/2015 1425   HCT 46.0 08/13/2015 1425   PLT 246 08/13/2015 1425   MCV 90.0 08/13/2015 1425   MCH 31.3 08/13/2015 1425   MCHC 34.8 08/13/2015 1425   RDW 12.6 08/13/2015 1425    No results found for: POCLITH, LITHIUM   No results found for: PHENYTOIN, PHENOBARB, VALPROATE, CBMZ   .res Assessment: Plan:    Concepcion was seen today for adhd and follow-up.  Diagnoses and all orders for this visit:  Attention deficit hyperactivity disorder (ADHD), combined type -     amphetamine-dextroamphetamine (ADDERALL) 10 MG tablet; take 1 tablet twice daily.  Keep appointment with MD January 20  Good response this time on Adderall 10 mg twice daily.  Much better focus and productivity.  No other mood symptoms.  Continue N-Acetylcysteine (NAC) at [id number] mg daily to help with mild cognitive problems like ADHD .  It can be combined with a B-complex vitamin as the B-12 and folate have been shown to sometimes enhance the effect.  He may gradually have more benefit with time and especially could have additional benefit when combined with a stimulant which she is interested in doing today.  Disc Vitamin D and B possibly also.  Option retry Clent Demark.  Disc behavioral management techniques to manage ADHD, structure, less distracting environment.  FU 6 mos  Meredith Staggers, MD, DFAPA   Please see After Visit Summary for patient specific instructions.  No  future appointments.  No orders of the defined types were placed in this encounter.   -------------------------------

## 2020-06-02 ENCOUNTER — Other Ambulatory Visit: Payer: Self-pay

## 2020-06-02 ENCOUNTER — Telehealth: Payer: Self-pay | Admitting: Psychiatry

## 2020-06-02 DIAGNOSIS — F902 Attention-deficit hyperactivity disorder, combined type: Secondary | ICD-10-CM

## 2020-06-02 MED ORDER — AMPHETAMINE-DEXTROAMPHETAMINE 10 MG PO TABS: ORAL_TABLET | ORAL | 0 refills | Status: DC

## 2020-06-02 NOTE — Telephone Encounter (Signed)
Last refill 02/05/20 for 90 day supply. Pended Rx for 90 day #180 for Dr. Jennelle Human to review and send

## 2020-06-02 NOTE — Telephone Encounter (Signed)
Pt would like a refill on Adderall. Please send to [facility] on [address]

## 2020-08-07 ENCOUNTER — Ambulatory Visit: Payer: Self-pay | Admitting: Psychiatry

## 2020-08-08 ENCOUNTER — Telehealth: Payer: Self-pay | Admitting: Adult Health

## 2020-08-08 NOTE — Telephone Encounter (Signed)
Called patient about a referral to [place] for monoclonal antibody therapy.  He notes that he is day 12 of symptom onset. I reviewed with him that he does not meet criteria based on the fact that he is further out than 10 days of therapy.  He verbalized understanding and thanks for my call.  Lillard Anes, NP

## 2020-09-08 ENCOUNTER — Telehealth: Payer: Self-pay | Admitting: Psychiatry

## 2020-09-08 NOTE — Telephone Encounter (Signed)
Pt requesting refill for Adderall @ [facility] [address] on canc list.canc 9/2 had covid.

## 2020-09-09 ENCOUNTER — Other Ambulatory Visit: Payer: Self-pay

## 2020-09-09 DIAGNOSIS — F902 Attention-deficit hyperactivity disorder, combined type: Secondary | ICD-10-CM

## 2020-09-09 NOTE — Telephone Encounter (Signed)
Last refill 06/02/2020 for #180 Pended for Dr. Jennelle Human to send

## 2020-09-10 MED ORDER — AMPHETAMINE-DEXTROAMPHETAMINE 10 MG PO TABS: ORAL_TABLET | ORAL | 0 refills | Status: DC

## 2020-10-23 ENCOUNTER — Telehealth: Payer: Self-pay | Admitting: Psychiatry

## 2021-01-06 ENCOUNTER — Other Ambulatory Visit: Payer: Self-pay | Admitting: Psychiatry

## 2021-01-06 ENCOUNTER — Telehealth: Payer: Self-pay | Admitting: Psychiatry

## 2021-01-06 DIAGNOSIS — F902 Attention-deficit hyperactivity disorder, combined type: Secondary | ICD-10-CM

## 2021-01-06 MED ORDER — AMPHETAMINE-DEXTROAMPHETAMINE 10 MG PO TABS: ORAL_TABLET | ORAL | 0 refills | Status: DC

## 2021-01-06 NOTE — Telephone Encounter (Signed)
Sent!

## 2021-01-06 NOTE — Telephone Encounter (Signed)
Pt requesting Rx for Adderall @ [facility]. [address]

## 2021-01-13 ENCOUNTER — Ambulatory Visit (INDEPENDENT_AMBULATORY_CARE_PROVIDER_SITE_OTHER): Payer: Self-pay | Admitting: Psychiatry

## 2021-01-13 ENCOUNTER — Encounter: Payer: Self-pay | Admitting: Psychiatry

## 2021-01-13 ENCOUNTER — Other Ambulatory Visit: Payer: Self-pay

## 2021-01-13 DIAGNOSIS — F411 Generalized anxiety disorder: Secondary | ICD-10-CM

## 2021-01-13 DIAGNOSIS — F902 Attention-deficit hyperactivity disorder, combined type: Secondary | ICD-10-CM

## 2021-01-13 MED ORDER — AMPHETAMINE-DEXTROAMPHETAMINE 30 MG PO TABS: 15.0000 mg | ORAL_TABLET | Freq: Two times a day (BID) | ORAL | 0 refills | Status: DC

## 2021-01-13 NOTE — Progress Notes (Signed)
Bruce Perez Perez [id number] 1966-08-01 55 y.o.  Subjective:   Patient ID:  Bruce Perez is a 55 y.o. (DOB 03/15/1966) male.  Chief Complaint:  Chief Complaint  Patient presents with  . Follow-up  . ADHD    Depression        Associated symptoms include myalgias.  Associated symptoms include no fatigue.   Bruce Perez presents to the office today for follow-up of ADD and episodic mood problems.  seen July 09, 2019.  He was still having cognitive complaints.  He did not want to try another psych med but was interested in trying off label N-acetylcysteine possibly combined with a B complex vitamin for its cognitive potential benefit.  seen August 30, 2019.  Want to retry stimulant.  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  Low libido. Wants to test testosterone level. He wants to retry Adderall.  He took it only very briefly years ago during a period of very high stress and therefore is difficult to evaluate its effects.  He is med sensitive so we will start with a low-dose 5 mg twice daily and increase to 10 mg twice daily.  We can go higher if that is ineffective.  He has received refills since that time.  02/2020 appointment with the following noted: Adderall helpful.  Immediately helped focus. Really good.  No SE problems.  Not more irritable nor edgy.  Mood good.  Going through breakup of 2 & 1/2 year relationship. Sleep is OK.  It lasts 6 hours. GF goes to integrative health and asks about alternative med supplements.   Some dizziness with the Strattera and nausea.  Took it daily since here uhntil the last few days.  Still chronic stress.  Energy is better with ADD meds.  Only thing that makes me task focused is opiates and understands it's not an option Easily distracted, makes mistakes and it hurts business.  Can be irritable.   Chronically stressed. And easily anxious.   Has girlfriend who's into holistic meds.  Plan: Continue  N-acetylcysteine and Adderall 10 mg twice daily with adequate response  01/13/2021 appointment with the following noted: Covid 07/2020 from GF both ended up on O2  Sick for 3 weeks. Feels his body has adjusted some and some tolerance with Adderall.  High levels energy tend to help his focus.  nOt much difference on days he takes it or not.   Mild SE dizziness .  Tried Adderall 15 and it tended to help a little more. Hydrocodone tends to help focus the most. Adderall lasts 6 hours.   Past Psychiatric Medication Trials: Provigil side effects, Strattera SE, Concerta, Wellbutrin, sertraline, fluoxetine, Trintellix, zolpidem, buspirone, Propranolol, hydroxyzine, MPH,  Adderall, Vyvanse, modafinil Tried NAC and B complex.  No benefit for energy.  Review of Systems:  Review of Systems  Constitutional: Negative for fatigue.  Musculoskeletal: Positive for arthralgias and myalgias.  Neurological: Negative for tremors and weakness.  Psychiatric/Behavioral: Positive for depression.    Medications: I have reviewed the patient's current medications.  Current Outpatient Medications  Medication Sig Dispense Refill  . Acetylcysteine (NAC) 500 MG CAPS Take by mouth.    Marland Kitchen atorvastatin (LIPITOR) 40 MG tablet Take 40 mg by mouth daily.    . cholecalciferol (VITAMIN D3) 25 MCG (1000 UT) tablet Take 1,000 Units by mouth daily.    . magnesium 30 MG tablet Take 30 mg by mouth daily.    . Omega-3 Fatty Acids (FISH OIL) 1000  MG CAPS Take by mouth.    Marland Kitchen amphetamine-dextroamphetamine (ADDERALL) 30 MG tablet Take 0.5 tablets by mouth 2 (two) times daily with a meal. 90 tablet 0   No current facility-administered medications for this visit.    Medication Side Effects: None  Allergies: No Known Allergies  History reviewed. No pertinent past medical history.  History reviewed. No pertinent family history.  Social History   Socioeconomic History  . Marital status: Single    Spouse name: Not on file  .  Number of children: Not on file  . Years of education: Not on file  . Highest education level: Not on file  Occupational History  . Not on file  Tobacco Use  . Smoking status: Never Smoker  . Smokeless tobacco: Former Engineer, water and Sexual Activity  . Alcohol use: Yes  . Drug use: No  . Sexual activity: Not on file  Other Topics Concern  . Not on file  Social History Narrative  . Not on file   Social Determinants of Health   Financial Resource Strain: Not on file  Food Insecurity: Not on file  Transportation Needs: Not on file  Physical Activity: Not on file  Stress: Not on file  Social Connections: Not on file  Intimate Partner Violence: Not on file    Past Medical History, Surgical history, Social history, and Family history were reviewed and updated as appropriate.   Please see review of systems for further details on the patient's review from today.   Objective:   Physical Exam:  There were no vitals taken for this visit.  Physical Exam Neurological:     Mental Status: He is alert and oriented to person, place, and time.     Cranial Nerves: No dysarthria.  Psychiatric:        Attention and Perception: He is attentive.        Mood and Affect: Mood normal.        Speech: Speech normal.        Behavior: Behavior is cooperative.        Thought Content: Thought content normal. Thought content is not paranoid or delusional. Thought content does not include homicidal or suicidal ideation. Thought content does not include homicidal or suicidal plan.        Cognition and Memory: Cognition and memory normal.        Judgment: Judgment normal.     Comments: Insight fair     Lab Review:     Component Value Date/Time   NA 137 08/13/2015 1833   K 3.8 08/13/2015 1833   CL 101 08/13/2015 1833   CO2 27 08/13/2015 1833   GLUCOSE 92 08/13/2015 1833   BUN 13 08/13/2015 1833   CREATININE 1.00 08/13/2015 1833   CALCIUM 9.3 08/13/2015 1833   PROT 6.9 08/13/2015 1833    ALBUMIN 4.0 08/13/2015 1833   AST 31 08/13/2015 1833   ALT 44 08/13/2015 1833   ALKPHOS 58 08/13/2015 1833   BILITOT 0.6 08/13/2015 1833   GFRNONAA >60 08/13/2015 1833   GFRAA >60 08/13/2015 1833       Component Value Date/Time   WBC 13.6 (H) 08/13/2015 1425   RBC 5.11 08/13/2015 1425   HGB 16.0 08/13/2015 1425   HCT 46.0 08/13/2015 1425   PLT 246 08/13/2015 1425   MCV 90.0 08/13/2015 1425   MCH 31.3 08/13/2015 1425   MCHC 34.8 08/13/2015 1425   RDW 12.6 08/13/2015 1425    No results found for: POCLITH,  LITHIUM   No results found for: PHENYTOIN, PHENOBARB, VALPROATE, CBMZ   .res Assessment: Plan:    Javeion was seen today for follow-up and adhd.  Diagnoses and all orders for this visit:  Attention deficit hyperactivity disorder (ADHD), combined type -     amphetamine-dextroamphetamine (ADDERALL) 30 MG tablet; Take 0.5 tablets by mouth 2 (two) times daily with a meal.  Generalized anxiety disorder  Less response this time on Adderall 10 mg twice daily. He's on low dose so increase to 15 mg BID.  No other mood symptoms.  He stopped NAC.  Disc Vitamin D and B possibly also.  Option retry Clent Demark.  Disc suspect he's at risk of opiate dependence bc of enhanced mood and energy from them.  Disc behavioral management techniques to manage ADHD, structure, less distracting environment.  FU 6 mos  Meredith Staggers, MD, DFAPA   Please see After Visit Summary for patient specific instructions.  No future appointments.  No orders of the defined types were placed in this encounter.   -------------------------------

## 2021-04-27 ENCOUNTER — Other Ambulatory Visit: Payer: Self-pay

## 2021-04-27 ENCOUNTER — Other Ambulatory Visit: Payer: Self-pay | Admitting: Psychiatry

## 2021-04-27 ENCOUNTER — Telehealth: Payer: Self-pay | Admitting: Psychiatry

## 2021-04-27 DIAGNOSIS — F902 Attention-deficit hyperactivity disorder, combined type: Secondary | ICD-10-CM

## 2021-04-27 MED ORDER — AMPHETAMINE-DEXTROAMPHETAMINE 30 MG PO TABS: 15.0000 mg | ORAL_TABLET | Freq: Two times a day (BID) | ORAL | 0 refills | Status: DC

## 2021-04-27 NOTE — Telephone Encounter (Signed)
Gurtaj called in for refill for Adderall 30mg . States that he only has a two day supply. Would like refill sent to [facility] [address] [place], [place].

## 2021-04-27 NOTE — Telephone Encounter (Signed)
Pended Rx for Dr. Jennelle Human to review and sign. 90 day supply

## 2022-09-27 ENCOUNTER — Ambulatory Visit (INDEPENDENT_AMBULATORY_CARE_PROVIDER_SITE_OTHER): Payer: Self-pay | Admitting: Psychiatry

## 2022-09-27 ENCOUNTER — Encounter: Payer: Self-pay | Admitting: Psychiatry

## 2022-09-27 DIAGNOSIS — F411 Generalized anxiety disorder: Secondary | ICD-10-CM

## 2022-09-27 DIAGNOSIS — F902 Attention-deficit hyperactivity disorder, combined type: Secondary | ICD-10-CM

## 2022-09-27 MED ORDER — AMPHETAMINE-DEXTROAMPHETAMINE 10 MG PO TABS: ORAL_TABLET | ORAL | 0 refills | Status: DC

## 2022-09-27 MED ORDER — AMPHETAMINE-DEXTROAMPHET ER 20 MG PO CP24: 20.0000 mg | ORAL_CAPSULE | Freq: Every day | ORAL | 0 refills | Status: DC

## 2022-09-27 NOTE — Progress Notes (Signed)
Bruce Perez [id number] 09-Sep-1966 56 y.o.  Subjective:   Patient ID:  Bruce Perez is a 56 y.o. (DOB 01-04-1966) male.  Chief Complaint:  Chief Complaint  Patient presents with   Follow-up   ADHD   Depression   Fatigue    Depression        Associated symptoms include no fatigue and no myalgias.   [name] presents to the office today for follow-up of ADD and episodic mood problems.  seen July 09, 2019.  He was still having cognitive complaints.  He did not want to try another psych med but was interested in trying off label N-acetylcysteine possibly combined with a B complex vitamin for its cognitive potential benefit.  seen August 30, 2019.  Want to retry stimulant.  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  Low libido. Wants to test testosterone level. He wants to retry Adderall.  He took it only very briefly years ago during a period of very high stress and therefore is difficult to evaluate its effects.  He is med sensitive so we will start with a low-dose 5 mg twice daily and increase to 10 mg twice daily.  We can go higher if that is ineffective.  He has received refills since that time.  02/2020 appointment with the following noted: Adderall helpful.  Immediately helped focus. Really good.  No SE problems.  Not more irritable nor edgy.  Mood good.  Going through breakup of 2 & 1/2 year relationship. Sleep is OK.  It lasts 6 hours. GF goes to integrative health and asks about alternative med supplements.   Some dizziness with the Strattera and nausea.  Took it daily since here uhntil the last few days.  Still chronic stress.  Energy is better with ADD meds.  Only thing that makes me task focused is opiates and understands it's not an option Easily distracted, makes mistakes and it hurts business.  Can be irritable.   Chronically stressed. And easily anxious.   Has girlfriend who's into holistic meds.  Plan: Continue  N-acetylcysteine and Adderall 10 mg twice daily with adequate response  01/13/2021 appointment with the following noted: Covid 07/2020 from GF both ended up on O2  Sick for 3 weeks. Feels his body has adjusted some and some tolerance with Adderall.  High levels energy tend to help his focus.  nOt much difference on days he takes it or not.   Mild SE dizziness .  Tried Adderall 15 and it tended to help a little more. Hydrocodone tends to help focus the most. Adderall lasts 6 hours.  09/27/22 appt noted: Testosterone replacement didn't solve energy problems but does have normal levels.  Feels he needs to stay on stimulant. Wants to resume Adderall  but maybe consider XR.  Can get benefit from up to 6 hours.  2 evenings per week works late.   Travelling to conference tomorrow.   Mood and anxiety are OK overall.  No irritability. Married a year ago. Met a great girl at [employer].   Health good.   Normal BP.   Past Psychiatric Medication Trials: Provigil side effects, Strattera SE, Concerta, Wellbutrin, sertraline, fluoxetine, Trintellix, zolpidem, buspirone, Propranolol, hydroxyzine, MPH,   Adderall, Vyvanse, modafinil Tried NAC and B complex.  No benefit for energy.  Review of Systems:  Review of Systems  Constitutional:  Negative for fatigue.  Musculoskeletal:  Positive for arthralgias. Negative for myalgias.  Neurological:  Negative for tremors and weakness.  Medications: I have reviewed the patient's current medications.  Current Outpatient Medications  Medication Sig Dispense Refill   amphetamine-dextroamphetamine (ADDERALL XR) 20 MG 24 hr capsule Take 1 capsule (20 mg total) by mouth daily. 30 capsule 0   amphetamine-dextroamphetamine (ADDERALL) 10 MG tablet 1 at 4PM  if needed for long work days. 30 tablet 0   atorvastatin (LIPITOR) 40 MG tablet Take 40 mg by mouth daily.     cholecalciferol (VITAMIN D3) 25 MCG (1000 UT) tablet Take 1,000 Units by mouth daily.     magnesium 30 MG  tablet Take 30 mg by mouth daily.     Omega-3 Fatty Acids (FISH OIL) 1000 MG CAPS Take by mouth.     Acetylcysteine (NAC) 500 MG CAPS Take by mouth. (Patient not taking: Reported on 09/27/2022)     No current facility-administered medications for this visit.    Medication Side Effects: None  Allergies: No Known Allergies  History reviewed. No pertinent past medical history.  History reviewed. No pertinent family history.  Social History   Socioeconomic History   Marital status: Single    Spouse name: Not on file   Number of children: Not on file   Years of education: Not on file   Highest education level: Not on file  Occupational History   Not on file  Tobacco Use   Smoking status: Never   Smokeless tobacco: Former  Substance and Sexual Activity   Alcohol use: Yes   Drug use: No   Sexual activity: Not on file  Other Topics Concern   Not on file  Social History Narrative   Not on file   Social Determinants of Health   Financial Resource Strain: Not on file  Food Insecurity: Not on file  Transportation Needs: Not on file  Physical Activity: Not on file  Stress: Not on file  Social Connections: Not on file  Intimate Partner Violence: Not on file    Past Medical History, Surgical history, Social history, and Family history were reviewed and updated as appropriate.   Please see review of systems for further details on the patient's review from today.   Objective:   Physical Exam:  There were no vitals taken for this visit.  Physical Exam Neurological:     Mental Status: He is alert and oriented to person, place, and time.     Cranial Nerves: No dysarthria.  Psychiatric:        Attention and Perception: He is attentive.        Mood and Affect: Mood normal.        Speech: Speech normal.        Behavior: Behavior is cooperative.        Thought Content: Thought content normal. Thought content is not paranoid or delusional. Thought content does not include  homicidal or suicidal ideation. Thought content does not include suicidal plan.        Cognition and Memory: Cognition and memory normal.        Judgment: Judgment normal.     Comments: Insight fair     Lab Review:     Component Value Date/Time   NA 137 08/13/2015 1833   K 3.8 08/13/2015 1833   CL 101 08/13/2015 1833   CO2 27 08/13/2015 1833   GLUCOSE 92 08/13/2015 1833   BUN 13 08/13/2015 1833   CREATININE 1.00 08/13/2015 1833   CALCIUM 9.3 08/13/2015 1833   PROT 6.9 08/13/2015 1833   ALBUMIN 4.0 08/13/2015 1833  AST 31 08/13/2015 1833   ALT 44 08/13/2015 1833   ALKPHOS 58 08/13/2015 1833   BILITOT 0.6 08/13/2015 1833   GFRNONAA >60 08/13/2015 1833   GFRAA >60 08/13/2015 1833       Component Value Date/Time   WBC 13.6 (H) 08/13/2015 1425   RBC 5.11 08/13/2015 1425   HGB 16.0 08/13/2015 1425   HCT 46.0 08/13/2015 1425   PLT 246 08/13/2015 1425   MCV 90.0 08/13/2015 1425   MCH 31.3 08/13/2015 1425   MCHC 34.8 08/13/2015 1425   RDW 12.6 08/13/2015 1425    No results found for: "POCLITH", "LITHIUM"   No results found for: "PHENYTOIN", "PHENOBARB", "VALPROATE", "CBMZ"   .res Assessment: Plan:    Kendon was seen today for follow-up, adhd, depression and fatigue.  Diagnoses and all orders for this visit:  Attention deficit hyperactivity disorder (ADHD), combined type -     amphetamine-dextroamphetamine (ADDERALL XR) 20 MG 24 hr capsule; Take 1 capsule (20 mg total) by mouth daily. -     amphetamine-dextroamphetamine (ADDERALL) 10 MG tablet; 1 at 4PM  if needed for long work days.  Generalized anxiety disorder  He has been on Adderall 15 mg twice daily with good response in the past.  He stopped it.  He had low testosterone levels and decided to pursue treatment with that and they have been corrected but his energy and focus is still not normal and not as good as when he was on Adderall.  He wants to resume Adderall.  Otherwise he is healthy.  Depression and  anxiety are under good control.  He is interested in trying the XR version  Adderall XR 20 mg every morning, Adderall 10 mg every afternoon on long work days.  He stopped NAC.  Disc Vitamin D and B possibly also.  Option retry Kris Hartmann or others.  Off opiates.  Hx feeling energized on them.  Disc behavioral management techniques to manage ADHD, structure, less distracting environment.  FU 6 mos  Lynder Parents, MD, DFAPA   Please see After Visit Summary for patient specific instructions.  No future appointments.  No orders of the defined types were placed in this encounter.   -------------------------------

## 2022-10-22 ENCOUNTER — Telehealth: Payer: Self-pay | Admitting: Psychiatry

## 2022-10-22 NOTE — Telephone Encounter (Signed)
Last filled 11/23, due 11/20

## 2022-10-22 NOTE — Telephone Encounter (Signed)
Pt called and asked for a refill on his adderall xr 20 mg. Pharmacy is [facility] [address] in [place]

## 2022-10-25 ENCOUNTER — Other Ambulatory Visit: Payer: Self-pay

## 2022-10-25 DIAGNOSIS — F902 Attention-deficit hyperactivity disorder, combined type: Secondary | ICD-10-CM

## 2022-10-25 NOTE — Telephone Encounter (Signed)
Pended.

## 2022-10-26 MED ORDER — AMPHETAMINE-DEXTROAMPHET ER 20 MG PO CP24: 20.0000 mg | ORAL_CAPSULE | Freq: Every day | ORAL | 0 refills | Status: DC

## 2023-01-03 ENCOUNTER — Telehealth: Payer: Self-pay | Admitting: Psychiatry

## 2023-01-03 NOTE — Telephone Encounter (Signed)
Awaiting pharmacy.

## 2023-01-03 NOTE — Telephone Encounter (Signed)
Bruce Perez called and LM at 9:24 for refill of his Adderall.  I called him back to verify which one. He said the slow release needs refill.  Send to [facility] on [address] in [place].  Has appt 03/07/23.  He will call [facility] to make sure they have it.  He feels they always has but will let us know if they have it.  He will be out in 2 days.

## 2023-01-04 ENCOUNTER — Other Ambulatory Visit: Payer: Self-pay

## 2023-01-31 ENCOUNTER — Telehealth: Payer: Self-pay | Admitting: Psychiatry

## 2023-01-31 ENCOUNTER — Other Ambulatory Visit: Payer: Self-pay

## 2023-01-31 DIAGNOSIS — F902 Attention-deficit hyperactivity disorder, combined type: Secondary | ICD-10-CM

## 2023-01-31 MED ORDER — AMPHETAMINE-DEXTROAMPHET ER 20 MG PO CP24: 20.0000 mg | ORAL_CAPSULE | Freq: Every day | ORAL | 0 refills | Status: DC

## 2023-01-31 NOTE — Telephone Encounter (Signed)
Pended.

## 2023-01-31 NOTE — Telephone Encounter (Signed)
Patient lvm for refill on Adderall '20mg'$ . Ph: [facility]

## 2023-03-03 ENCOUNTER — Telehealth: Payer: Self-pay | Admitting: Psychiatry

## 2023-03-03 ENCOUNTER — Other Ambulatory Visit: Payer: Self-pay | Admitting: Psychiatry

## 2023-03-03 DIAGNOSIS — F902 Attention-deficit hyperactivity disorder, combined type: Secondary | ICD-10-CM

## 2023-03-03 MED ORDER — AMPHETAMINE-DEXTROAMPHET ER 20 MG PO CP24: 20.0000 mg | ORAL_CAPSULE | Freq: Every day | ORAL | 0 refills | Status: DC

## 2023-03-03 NOTE — Telephone Encounter (Signed)
Pt called at 10:22a to request refill of Adderall  to  [id number] - [address], [place] [address] Phone: [phone]  Fax: [phone]   It is available there.  Next appt 4/1

## 2023-03-07 ENCOUNTER — Encounter: Payer: Self-pay | Admitting: Psychiatry

## 2023-03-07 ENCOUNTER — Ambulatory Visit (INDEPENDENT_AMBULATORY_CARE_PROVIDER_SITE_OTHER): Payer: Self-pay | Admitting: Psychiatry

## 2023-03-07 VITALS — BP 138/97 | HR 79

## 2023-03-07 DIAGNOSIS — F411 Generalized anxiety disorder: Secondary | ICD-10-CM

## 2023-03-07 DIAGNOSIS — F902 Attention-deficit hyperactivity disorder, combined type: Secondary | ICD-10-CM

## 2023-03-07 MED ORDER — AMPHETAMINE-DEXTROAMPHET ER 20 MG PO CP24: 20.0000 mg | ORAL_CAPSULE | Freq: Every day | ORAL | 0 refills | Status: DC

## 2023-03-07 MED ORDER — AMPHETAMINE-DEXTROAMPHETAMINE 10 MG PO TABS: ORAL_TABLET | ORAL | 0 refills | Status: DC

## 2023-03-07 NOTE — Progress Notes (Signed)
RYE SNAPE Perez [id number] 1966-09-06 57 y.o.  Subjective:   Patient ID:  Bruce Perez is a 57 y.o. (DOB [birth year]) male.  Chief Complaint:  Chief Complaint  Patient presents with   Follow-up   ADD    Depression        Associated symptoms include no fatigue and no myalgias.   Bruce Perez presents to the office today for follow-up of ADD and episodic mood problems.  seen July 09, 2019.  He was still having cognitive complaints.  He did not want to try another psych med but was interested in trying off label N-acetylcysteine possibly combined with a B complex vitamin for its cognitive potential benefit.  seen August 30, 2019.  Want to retry stimulant.  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  Low libido. Wants to test testosterone level. He wants to retry Adderall.  He took it only very briefly years ago during a period of very high stress and therefore is difficult to evaluate its effects.  He is med sensitive so we will start with a low-dose 5 mg twice daily and increase to 10 mg twice daily.  We can go higher if that is ineffective.  He has received refills since that time.  02/2020 appointment with the following noted: Adderall helpful.  Immediately helped focus. Really good.  No SE problems.  Not more irritable nor edgy.  Mood good.  Going through breakup of 2 & 1/2 year relationship. Sleep is OK.  It lasts 6 hours. GF goes to integrative health and asks about alternative med supplements.   Some dizziness with the Strattera and nausea.  Took it daily since here [place] the last few days.  Still chronic stress.  Energy is better with ADD meds.  Only thing that makes me task focused is opiates and understands it's not an option Easily distracted, makes mistakes and it hurts business.  Can be irritable.   Chronically stressed. And easily anxious.   Has girlfriend who's into holistic meds.  Plan: Continue N-acetylcysteine and Adderall  10 mg twice daily with adequate response  01/13/2021 appointment with the following noted: Covid 07/2020 from GF both ended up on O2  Sick for 3 weeks. Feels his body has adjusted some and some tolerance with Adderall.  High levels energy tend to help his focus.  nOt much difference on days he takes it or not.   Mild SE dizziness .  Tried Adderall 15 and it tended to help a little more. Hydrocodone tends to help focus the most. Adderall lasts 6 hours.  09/27/22 appt noted: Testosterone replacement didn't solve energy problems but does have normal levels.  Feels he needs to stay on stimulant. Wants to resume Adderall  but maybe consider XR.  Can get benefit from up to 6 hours.  2 evenings per week works late.   Travelling to conference tomorrow.   Mood and anxiety are OK overall.  No irritability. Married a year ago. Met a great girl at [employer].   Health good.   Normal BP.  03/07/23 appt : Taking Adderall XR 20 AM and occ Adderall 10 mg. Didn't notice much effect from testosterone after supplementation. Getting good benefit with Adderall.  But still can make mistakes.    No sig SE.   No prolems with mood and anxiety. Appetite and sleep are good.  Did start snoring.   No SE with testosterone .   Past Psychiatric Medication Trials: Provigil side effects, Strattera  SE, Concerta, Wellbutrin, sertraline, fluoxetine, Trintellix, zolpidem, buspirone, Propranolol, hydroxyzine, MPH,   Adderall, Vyvanse, modafinil Tried NAC and B complex.  No benefit for energy.  Review of Systems:  Review of Systems  Constitutional:  Negative for fatigue.  Musculoskeletal:  Positive for arthralgias. Negative for myalgias.  Neurological:  Negative for tremors.    Medications: I have reviewed the patient's current medications.  Current Outpatient Medications  Medication Sig Dispense Refill   Acetylcysteine (NAC) 500 MG CAPS Take by mouth.     amphetamine-dextroamphetamine (ADDERALL XR) 20 MG 24 hr capsule  Take 1 capsule (20 mg total) by mouth daily. 30 capsule 0   atorvastatin (LIPITOR) 40 MG tablet Take 40 mg by mouth daily.     cholecalciferol (VITAMIN D3) 25 MCG (1000 UT) tablet Take 1,000 Units by mouth daily.     magnesium 30 MG tablet Take 30 mg by mouth daily.     Omega-3 Fatty Acids (FISH OIL) 1000 MG CAPS Take by mouth.     [START ON 04/04/2023] amphetamine-dextroamphetamine (ADDERALL XR) 20 MG 24 hr capsule Take 1 capsule (20 mg total) by mouth daily. 30 capsule 0   [START ON 05/02/2023] amphetamine-dextroamphetamine (ADDERALL XR) 20 MG 24 hr capsule Take 1 capsule (20 mg total) by mouth daily. 30 capsule 0   amphetamine-dextroamphetamine (ADDERALL) 10 MG tablet 1 at 4PM  if needed for long work days. 30 tablet 0   No current facility-administered medications for this visit.    Medication Side Effects: None  Allergies: No Known Allergies  History reviewed. No pertinent past medical history.  History reviewed. No pertinent family history.  Social History   Socioeconomic History   Marital status: Married    Spouse name: Not on file   Number of children: Not on file   Years of education: Not on file   Highest education level: Not on file  Occupational History   Not on file  Tobacco Use   Smoking status: Never   Smokeless tobacco: Former  Substance and Sexual Activity   Alcohol use: Yes   Drug use: No   Sexual activity: Not on file  Other Topics Concern   Not on file  Social History Narrative   Not on file   Social Determinants of Health   Financial Resource Strain: Not on file  Food Insecurity: Not on file  Transportation Needs: Not on file  Physical Activity: Not on file  Stress: Not on file  Social Connections: Not on file  Intimate Partner Violence: Not on file    Past Medical History, Surgical history, Social history, and Family history were reviewed and updated as appropriate.   Please see review of systems for further details on the patient's review  from today.   Objective:   Physical Exam:  BP (!) 138/97   Pulse 79   Physical Exam Neurological:     Mental Status: He is alert and oriented to person, place, and time.     Cranial Nerves: No dysarthria.  Psychiatric:        Attention and Perception: He is attentive.        Mood and Affect: Mood normal. Mood is not depressed.        Speech: Speech normal.        Behavior: Behavior is cooperative.        Thought Content: Thought content normal. Thought content is not paranoid or delusional. Thought content does not include homicidal or suicidal ideation. Thought content does not include suicidal plan.  Cognition and Memory: Cognition and memory normal.        Judgment: Judgment normal.     Comments: Insight fair     Lab Review:     Component Value Date/Time   NA 137 08/13/2015 1833   K 3.8 08/13/2015 1833   CL 101 08/13/2015 1833   CO2 27 08/13/2015 1833   GLUCOSE 92 08/13/2015 1833   BUN 13 08/13/2015 1833   CREATININE 1.00 08/13/2015 1833   CALCIUM 9.3 08/13/2015 1833   PROT 6.9 08/13/2015 1833   ALBUMIN 4.0 08/13/2015 1833   AST 31 08/13/2015 1833   ALT 44 08/13/2015 1833   ALKPHOS 58 08/13/2015 1833   BILITOT 0.6 08/13/2015 1833   GFRNONAA >60 08/13/2015 1833   GFRAA >60 08/13/2015 1833       Component Value Date/Time   WBC 13.6 (H) 08/13/2015 1425   RBC 5.11 08/13/2015 1425   HGB 16.0 08/13/2015 1425   HCT 46.0 08/13/2015 1425   PLT 246 08/13/2015 1425   MCV 90.0 08/13/2015 1425   MCH 31.3 08/13/2015 1425   MCHC 34.8 08/13/2015 1425   RDW 12.6 08/13/2015 1425    No results found for: "POCLITH", "LITHIUM"   No results found for: "PHENYTOIN", "PHENOBARB", "VALPROATE", "CBMZ"   .res Assessment: Plan:    Bruce Perez was seen today for follow-up and add.  Diagnoses and all orders for this visit:  Attention deficit hyperactivity disorder (ADHD), combined type -     amphetamine-dextroamphetamine (ADDERALL) 10 MG tablet; 1 at 4PM  if needed for  long work days. -     amphetamine-dextroamphetamine (ADDERALL XR) 20 MG 24 hr capsule; Take 1 capsule (20 mg total) by mouth daily. -     amphetamine-dextroamphetamine (ADDERALL XR) 20 MG 24 hr capsule; Take 1 capsule (20 mg total) by mouth daily.  Generalized anxiety disorder    He had low testosterone levels and decided to pursue treatment with that and they have been corrected but his energy and focus is still not normal and not as good as on Adderall XR 20 mg AM  Otherwise he is healthy.  Depression and anxiety are under good control.    Adderall XR 20 mg every morning, Adderall 10 mg every afternoon on long work days.  Good response XR vs IR  Discussed potential benefits, risks, and side effects of stimulants with patient to include increased heart rate, palpitations, insomnia, increased anxiety, increased irritability, or decreased appetite.  Instructed patient to contact office if experiencing any significant tolerability issues. His BP is not consistetnly high but is a little high today  He stopped NAC.  Disc Vitamin D and B possibly also.  Option retry Kris Hartmann or others.  Off opiates for an extended period.  Hx feeling energized on them.  Disc behavioral management techniques to manage ADHD, structure, less distracting environment.  FU 6 mos  Lynder Parents, MD, DFAPA   Please see After Visit Summary for patient specific instructions.  No future appointments.  No orders of the defined types were placed in this encounter.   -------------------------------

## 2023-04-18 ENCOUNTER — Other Ambulatory Visit (HOSPITAL_BASED_OUTPATIENT_CLINIC_OR_DEPARTMENT_OTHER): Payer: Self-pay | Admitting: Family Medicine

## 2023-04-18 DIAGNOSIS — E782 Mixed hyperlipidemia: Secondary | ICD-10-CM

## 2023-05-03 ENCOUNTER — Ambulatory Visit (HOSPITAL_BASED_OUTPATIENT_CLINIC_OR_DEPARTMENT_OTHER)
Admission: RE | Admit: 2023-05-03 | Discharge: 2023-05-03 | Disposition: A | Discharge status: Home / Self Care | Payer: Self-pay | Source: Ambulatory Visit | Attending: Family Medicine | Admitting: Family Medicine

## 2023-05-03 DIAGNOSIS — E782 Mixed hyperlipidemia: Secondary | ICD-10-CM | POA: Insufficient documentation

## 2023-06-07 ENCOUNTER — Telehealth: Payer: Self-pay | Admitting: Psychiatry

## 2023-06-07 ENCOUNTER — Other Ambulatory Visit: Payer: Self-pay

## 2023-06-07 DIAGNOSIS — F902 Attention-deficit hyperactivity disorder, combined type: Secondary | ICD-10-CM

## 2023-06-07 NOTE — Telephone Encounter (Signed)
Pt called at 11:55a requesting 3 months of refills of Adderall 10 mg and 3 months of Adderal XR 20 mg to  [facility] [id number] - [place], [place] - [address], [place] [address] Phone: [phone]  Fax: [phone]   He is down to one pill.   No upcoming appts scheduled.

## 2023-06-07 NOTE — Telephone Encounter (Signed)
Pended.

## 2023-06-08 MED ORDER — AMPHETAMINE-DEXTROAMPHETAMINE 10 MG PO TABS
ORAL_TABLET | ORAL | 0 refills | Status: DC
Start: 2023-08-02 — End: 2024-06-15

## 2023-06-08 MED ORDER — AMPHETAMINE-DEXTROAMPHETAMINE 10 MG PO TABS
ORAL_TABLET | ORAL | 0 refills | Status: DC
Start: 2023-07-05 — End: 2023-11-15

## 2023-06-08 MED ORDER — AMPHETAMINE-DEXTROAMPHETAMINE 10 MG PO TABS
ORAL_TABLET | ORAL | 0 refills | Status: DC
Start: 2023-06-08 — End: 2024-05-08

## 2023-06-08 MED ORDER — AMPHETAMINE-DEXTROAMPHET ER 20 MG PO CP24
20.0000 mg | ORAL_CAPSULE | Freq: Every day | ORAL | 0 refills | Status: DC
Start: 2023-06-08 — End: 2023-11-15

## 2023-06-08 MED ORDER — AMPHETAMINE-DEXTROAMPHET ER 20 MG PO CP24
20.0000 mg | ORAL_CAPSULE | Freq: Every day | ORAL | 0 refills | Status: DC
Start: 2023-08-02 — End: 2023-09-12

## 2023-06-08 MED ORDER — AMPHETAMINE-DEXTROAMPHET ER 20 MG PO CP24
20.0000 mg | ORAL_CAPSULE | Freq: Every day | ORAL | 0 refills | Status: DC
Start: 2023-07-05 — End: 2023-10-10

## 2023-09-12 ENCOUNTER — Telehealth: Payer: Self-pay | Admitting: Psychiatry

## 2023-09-12 ENCOUNTER — Other Ambulatory Visit: Payer: Self-pay

## 2023-09-12 DIAGNOSIS — F902 Attention-deficit hyperactivity disorder, combined type: Secondary | ICD-10-CM

## 2023-09-12 MED ORDER — AMPHETAMINE-DEXTROAMPHET ER 20 MG PO CP24
20.0000 mg | ORAL_CAPSULE | Freq: Every day | ORAL | 0 refills | Status: DC
Start: 2023-09-12 — End: 2023-10-17

## 2023-09-12 NOTE — Telephone Encounter (Signed)
RF on Adderall Xr20mg  needed. He made a f/up appt for 11/7. Please send in to: [facility] [id number] - [place], [place] - [address]  [address],

## 2023-09-12 NOTE — Telephone Encounter (Signed)
Pended.

## 2023-10-10 ENCOUNTER — Telehealth: Payer: Self-pay | Admitting: Psychiatry

## 2023-10-10 ENCOUNTER — Other Ambulatory Visit: Payer: Self-pay

## 2023-10-10 DIAGNOSIS — F902 Attention-deficit hyperactivity disorder, combined type: Secondary | ICD-10-CM

## 2023-10-10 MED ORDER — AMPHETAMINE-DEXTROAMPHET ER 20 MG PO CP24
20.0000 mg | ORAL_CAPSULE | Freq: Every day | ORAL | 0 refills | Status: DC
Start: 2023-10-10 — End: 2023-12-26

## 2023-10-10 NOTE — Telephone Encounter (Signed)
Bruce Perez called at 11:10 to request refill of her Adderal XR 20mg .  This will be an early refill.  Not due until 11/6 or 7, but [place] and needs to refill before he leaves.  He has a video appt 11/6 but he will on the way to the mountains or already there.  He needs to be able to pick up by 11/5. Send to [facility] [id number] - [place], [place] - [address]

## 2023-10-10 NOTE — Telephone Encounter (Signed)
PENDED

## 2023-10-12 ENCOUNTER — Telehealth: Payer: Self-pay | Admitting: Psychiatry

## 2023-10-17 ENCOUNTER — Other Ambulatory Visit: Payer: Self-pay

## 2023-10-17 ENCOUNTER — Telehealth: Payer: Self-pay | Admitting: Psychiatry

## 2023-10-17 DIAGNOSIS — F902 Attention-deficit hyperactivity disorder, combined type: Secondary | ICD-10-CM

## 2023-10-17 MED ORDER — AMPHETAMINE-DEXTROAMPHET ER 20 MG PO CP24
20.0000 mg | ORAL_CAPSULE | Freq: Every day | ORAL | 0 refills | Status: DC
Start: 2023-10-17 — End: 2024-01-26

## 2023-10-17 NOTE — Telephone Encounter (Signed)
Pended.

## 2023-10-17 NOTE — Telephone Encounter (Signed)
Bruce Perez called to request refill of his Adderall XR 20 mg capsule.  Last RX for was for only #7 because he had an appt 11/6.  But we cancelled his appt because of the water issues.  Next appt available is 12/20/23.  He is also on the CXL.  He will need refills until he can get back in.  Send to [facility] [id number] - [place], [place] - [address]

## 2023-11-15 ENCOUNTER — Telehealth: Payer: Self-pay | Admitting: Psychiatry

## 2023-11-15 ENCOUNTER — Other Ambulatory Visit: Payer: Self-pay

## 2023-11-15 DIAGNOSIS — F902 Attention-deficit hyperactivity disorder, combined type: Secondary | ICD-10-CM

## 2023-11-15 NOTE — Telephone Encounter (Signed)
Pended Adderall XR 20 mg and IR 10 mg to [facility].

## 2023-11-15 NOTE — Telephone Encounter (Signed)
Pt Lvm @ 9:19a requesting refill of generic Adderall 10mg  and generic Adderall XR 20mg  to   [facility] [id number] - [place], [place] - [address], [place] [address] Phone: [phone]  Fax: [phone]   Next appt 1/14

## 2023-11-16 MED ORDER — AMPHETAMINE-DEXTROAMPHETAMINE 10 MG PO TABS
ORAL_TABLET | ORAL | 0 refills | Status: DC
Start: 2023-11-16 — End: 2023-12-26

## 2023-11-16 MED ORDER — AMPHETAMINE-DEXTROAMPHET ER 20 MG PO CP24
20.0000 mg | ORAL_CAPSULE | Freq: Every day | ORAL | 0 refills | Status: DC
Start: 2023-11-16 — End: 2024-05-08

## 2023-12-20 ENCOUNTER — Ambulatory Visit: Payer: Self-pay | Admitting: Psychiatry

## 2023-12-20 DIAGNOSIS — Z538 Procedure and treatment not carried out for other reasons: Secondary | ICD-10-CM

## 2023-12-20 NOTE — Progress Notes (Signed)
 MD was behind schedule and then able to connect with pt for virtual visit with 4 phone calls. Will RS

## 2023-12-26 ENCOUNTER — Other Ambulatory Visit: Payer: Self-pay

## 2023-12-26 ENCOUNTER — Telehealth: Payer: Self-pay | Admitting: Psychiatry

## 2023-12-26 DIAGNOSIS — F902 Attention-deficit hyperactivity disorder, combined type: Secondary | ICD-10-CM

## 2023-12-26 MED ORDER — AMPHETAMINE-DEXTROAMPHETAMINE 10 MG PO TABS
ORAL_TABLET | ORAL | 0 refills | Status: DC
Start: 2023-12-26 — End: 2024-07-20

## 2023-12-26 MED ORDER — AMPHETAMINE-DEXTROAMPHET ER 20 MG PO CP24
20.0000 mg | ORAL_CAPSULE | Freq: Every day | ORAL | 0 refills | Status: DC
Start: 2023-12-26 — End: 2024-06-15

## 2023-12-26 NOTE — Telephone Encounter (Signed)
Pt needs RF on both does of Adderall. He would like them sent to [facility]. Made an appt for next avail- 02/23/24. Send to: [facility] [id number] - [place], [place] - [address], [place] [address]

## 2023-12-26 NOTE — Telephone Encounter (Signed)
PENDED ADDERALL 20 MG AND 10 MG TO [facility].

## 2024-01-13 ENCOUNTER — Telehealth: Payer: Self-pay | Admitting: Psychiatry

## 2024-01-13 NOTE — Telephone Encounter (Signed)
 Patient is asking about a supplement called Thesis https://takethesis.[email or web address]/ He read that it was helpful with focus. He said he feels like with his stimulant he crashes and is irritable. He said he would prefer not to take any medications at all.   Adderall XR 20 mg every morning, Adderall 10 mg every afternoon on long work days.

## 2024-01-13 NOTE — Telephone Encounter (Signed)
 Pt called around 12:40 and LM. He saw a new medication advertised that would be in place of a stimulant. He would like to ask about it.

## 2024-01-13 NOTE — Telephone Encounter (Signed)
 LVM to [facility]

## 2024-01-16 NOTE — Telephone Encounter (Signed)
 It contains supplements that often help attention and focus but are not likely to be as effective as a stimulant.  But also shouldn't have the SE of stimulants either.  He can Debe Falco if he wants with or without the stimulant.

## 2024-01-16 NOTE — Telephone Encounter (Signed)
 LVM to [facility]

## 2024-01-17 NOTE — Telephone Encounter (Signed)
Patient notified of info on Porter.

## 2024-01-26 ENCOUNTER — Other Ambulatory Visit: Payer: Self-pay

## 2024-01-26 ENCOUNTER — Telehealth: Payer: Self-pay | Admitting: Psychiatry

## 2024-01-26 DIAGNOSIS — F902 Attention-deficit hyperactivity disorder, combined type: Secondary | ICD-10-CM

## 2024-01-26 MED ORDER — AMPHETAMINE-DEXTROAMPHET ER 20 MG PO CP24
20.0000 mg | ORAL_CAPSULE | Freq: Every day | ORAL | 0 refills | Status: DC
Start: 2024-01-26 — End: 2024-07-20

## 2024-01-26 NOTE — Telephone Encounter (Signed)
PENDED XR 20 MG TO [facility].

## 2024-01-26 NOTE — Telephone Encounter (Signed)
Pt called to request RF on Adderall XR 20mg  ( only XR) and will need it sent in to [facility] on [address]pt 02/23/24

## 2024-02-23 ENCOUNTER — Encounter: Payer: Self-pay | Admitting: Psychiatry

## 2024-02-23 ENCOUNTER — Ambulatory Visit: Payer: Self-pay | Admitting: Psychiatry

## 2024-02-23 DIAGNOSIS — F902 Attention-deficit hyperactivity disorder, combined type: Secondary | ICD-10-CM | POA: Diagnosis not present

## 2024-02-23 DIAGNOSIS — F411 Generalized anxiety disorder: Secondary | ICD-10-CM

## 2024-02-23 NOTE — Progress Notes (Signed)
 Bruce Perez [id number] 24-Aug-1966 58 y.o.  Virtual Visit via Telephone Note  I connected with pt by telephone and verified that I am speaking with the correct person using two identifiers.   I discussed the limitations, risks, security and privacy concerns of performing an evaluation and management service by telephone and the availability of in person appointments. I also discussed with the patient that there may be a patient responsible charge related to this service. The patient expressed understanding and agreed to proceed.  I discussed the assessment and treatment plan with the patient. The patient was provided an opportunity to ask questions and all were answered. The patient agreed with the plan and demonstrated an understanding of the instructions.   The patient was advised to call back or seek an in-person evaluation if the symptoms worsen or if the condition fails to improve as anticipated.  I provided 20 minutes of non-face-to-face time during this encounter. The call started at 930 and ended at 950. The patient was located at work and the provider was located office.   Subjective:   Patient ID:  Bruce Perez is a 58 y.o. (DOB [birth year]) male.  Chief Complaint:  Chief Complaint  Patient presents with   Follow-up   ADD    Bruce Perez presents to the office today for follow-up of ADD and episodic mood problems.  seen July 09, 2019.  He was still having cognitive complaints.  He did not want to try another psych med but was interested in trying off label N-acetylcysteine possibly combined with a B complex vitamin for its cognitive potential benefit.  seen August 30, 2019.  Want to retry stimulant.  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  Low libido. Wants to test testosterone level. He wants to retry Adderall.  He took it only very briefly years ago during a period of very high stress and therefore is difficult to  evaluate its effects.  He is med sensitive so we will start with a low-dose 5 mg twice daily and increase to 10 mg twice daily.  We can go higher if that is ineffective.  He has received refills since that time.  02/2020 appointment with the following noted: Adderall helpful.  Immediately helped focus. Really good.  No SE problems.  Not more irritable nor edgy.  Mood good.  Going through breakup of 2 & 1/2 year relationship. Sleep is OK.  It lasts 6 hours. GF goes to integrative health and asks about alternative med supplements.   Some dizziness with the Strattera and nausea.  Took it daily since here uhntil the last few days.  Still chronic stress.  Energy is better with ADD meds.  Only thing that makes me task focused is opiates and understands it's not an option Easily distracted, makes mistakes and it hurts business.  Can be irritable.   Chronically stressed. And easily anxious.   Has girlfriend who's into holistic meds.  Plan: Continue N-acetylcysteine and Adderall 10 mg twice daily with adequate response  01/13/2021 appointment with the following noted: Covid 07/2020 from GF both ended up on O2  Sick for 3 weeks. Feels his body has adjusted some and some tolerance with Adderall.  High levels energy tend to help his focus.  nOt much difference on days he takes it or not.   Mild SE dizziness .  Tried Adderall 15 and it tended to help a little more. Hydrocodone tends to help focus the most. Adderall lasts  6 hours.  09/27/22 appt noted: Testosterone replacement didn't solve energy problems but does have normal levels.  Feels he needs to stay on stimulant. Wants to resume Adderall  but maybe consider XR.  Can get benefit from up to 6 hours.  2 evenings per week works late.   Travelling to conference tomorrow.   Mood and anxiety are OK overall.  No irritability. Married a year ago. Met a great girl at [employer].   Health good.   Normal BP.  03/07/23 appt : Taking Adderall XR 20 AM and occ  Adderall 10 mg. Didn't notice much effect from testosterone after supplementation. Getting good benefit with Adderall.  But still can make mistakes.    No sig SE.   No prolems with mood and anxiety. Appetite and sleep are good.  Did start snoring.   No SE with testosterone .  02/23/24 appt noted: Med: Bruce Perez for a couple of weeks with benefit.  Testosterone 18 mos. Nasty virus this week.  Flu-like sx with severe vertigo. Thesis for a couple of weeks with benefit.  Pretty much same benefit as Adderall for energy.   Also didn't have crash as much as Adderall. Taking [employer] and it helps. No SE noted.     Past Psychiatric Medication Trials:  Thesis for a couple of weeks with benefit. Wellbutrin, sertraline, fluoxetine, Trintellix,  zolpidem, buspirone, Propranolol, hydroxyzine,    Adderall, Vyvanse, Provigil side effects, Strattera SE, Concerta, MPH,  Tried NAC and B complex.  No benefit for energy.  Review of Systems:  Review of Systems  Constitutional:  Negative for fatigue.  Musculoskeletal:  Positive for arthralgias. Negative for myalgias.  Neurological:  Negative for tremors.  Psychiatric/Behavioral:  The patient is not nervous/anxious.     Medications: I have reviewed the patient's current medications.  Current Outpatient Medications  Medication Sig Dispense Refill   Acetylcysteine (NAC) 500 MG CAPS Take by mouth. (Patient not taking: Reported on 02/23/2024)     amphetamine-dextroamphetamine (ADDERALL XR) 20 MG 24 hr capsule Take 1 capsule (20 mg total) by mouth daily. (Patient not taking: Reported on 02/23/2024) 30 capsule 0   amphetamine-dextroamphetamine (ADDERALL XR) 20 MG 24 hr capsule Take 1 capsule (20 mg total) by mouth daily. (Patient not taking: Reported on 02/23/2024) 30 capsule 0   amphetamine-dextroamphetamine (ADDERALL XR) 20 MG 24 hr capsule Take 1 capsule (20 mg total) by mouth daily. (Patient not taking: Reported on 02/23/2024) 30 capsule 0    amphetamine-dextroamphetamine (ADDERALL) 10 MG tablet 1 at 4PM  if needed for long work days. (Patient not taking: Reported on 02/23/2024) 30 tablet 0   amphetamine-dextroamphetamine (ADDERALL) 10 MG tablet 1 at 4PM  if needed for long work days. (Patient not taking: Reported on 02/23/2024) 30 tablet 0   amphetamine-dextroamphetamine (ADDERALL) 10 MG tablet 1 at 4PM  if needed for long work days. (Patient not taking: Reported on 02/23/2024) 30 tablet 0   atorvastatin (LIPITOR) 40 MG tablet Take 40 mg by mouth daily. (Patient not taking: Reported on 02/23/2024)     cholecalciferol (VITAMIN D3) 25 MCG (1000 UT) tablet Take 1,000 Units by mouth daily. (Patient not taking: Reported on 02/23/2024)     magnesium 30 MG tablet Take 30 mg by mouth daily. (Patient not taking: Reported on 02/23/2024)     Omega-3 Fatty Acids (FISH OIL) 1000 MG CAPS Take by mouth. (Patient not taking: Reported on 02/23/2024)     No current facility-administered medications for this visit.    Medication  Side Effects: None  Allergies: No Known Allergies  History reviewed. No pertinent past medical history.  History reviewed. No pertinent family history.  Social History   Socioeconomic History   Marital status: Married    Spouse name: Not on file   Number of children: Not on file   Years of education: Not on file   Highest education level: Not on file  Occupational History   Not on file  Tobacco Use   Smoking status: Never   Smokeless tobacco: Former  Substance and Sexual Activity   Alcohol use: Yes   Drug use: No   Sexual activity: Not on file  Other Topics Concern   Not on file  Social History Narrative   Not on file   Social Drivers of Health   Financial Resource Strain: Not on file  Food Insecurity: Not on file  Transportation Needs: Not on file  Physical Activity: Not on file  Stress: Not on file  Social Connections: Unknown (04/19/2022)   Received from [facility]   Social Network     Social Network: Not on file  Intimate Partner Violence: Unknown (03/11/2022)   Received from [facility], [facility]   HITS    Physically Hurt: Not on file    Insult or Talk Down To: Not on file    Threaten Physical Harm: Not on file    Scream or Curse: Not on file    Past Medical History, Surgical history, Social history, and Family history were reviewed and updated as appropriate.   Please see review of systems for further details on the patient's review from today.   Objective:   Physical Exam:  There were no vitals taken for this visit.  Physical Exam Neurological:     Mental Status: He is alert and oriented to person, place, and time.     Cranial Nerves: No dysarthria.  Psychiatric:        Attention and Perception: He is attentive.        Mood and Affect: Mood normal. Mood is not depressed.        Speech: Speech normal.        Behavior: Behavior is cooperative.        Thought Content: Thought content normal. Thought content is not paranoid or delusional. Thought content does not include homicidal or suicidal ideation. Thought content does not include suicidal plan.        Cognition and Memory: Cognition and memory normal.        Judgment: Judgment normal.     Comments: Insight fair     Lab Review:     Component Value Date/Time   NA 137 08/13/2015 1833   K 3.8 08/13/2015 1833   CL 101 08/13/2015 1833   CO2 27 08/13/2015 1833   GLUCOSE 92 08/13/2015 1833   BUN 13 08/13/2015 1833   CREATININE 1.00 08/13/2015 1833   CALCIUM 9.3 08/13/2015 1833   PROT 6.9 08/13/2015 1833   ALBUMIN 4.0 08/13/2015 1833   AST 31 08/13/2015 1833   ALT 44 08/13/2015 1833   ALKPHOS 58 08/13/2015 1833   BILITOT 0.6 08/13/2015 1833   GFRNONAA >60 08/13/2015 1833   GFRAA >60 08/13/2015 1833       Component Value Date/Time   WBC 13.6 (H) 08/13/2015 1425   RBC 5.11 08/13/2015 1425   HGB 16.0 08/13/2015 1425   HCT 46.0 08/13/2015 1425   PLT 246 08/13/2015 1425   MCV 90.0  08/13/2015 1425  MCH 31.3 08/13/2015 1425   MCHC 34.8 08/13/2015 1425   RDW 12.6 08/13/2015 1425    No results found for: "POCLITH", "LITHIUM"   No results found for: "PHENYTOIN", "PHENOBARB", "VALPROATE", "CBMZ"   .res Assessment: Plan:    Bruce "Chip" was seen today for follow-up and add.  Diagnoses and all orders for this visit:  Attention deficit hyperactivity disorder (ADHD), combined type  Generalized anxiety disorder  Otherwise he is healthy.  Depression and anxiety are under good control.    Bc benefit of [employer] equivalent to Adderall he wants to continue this instead of Adderall.    Hold Adderall XR 20 mg every morning, Adderall 10 mg every afternoon on long work days.  Good response XR vs IR  Discussed potential benefits, risks, and side effects of stimulants with patient to include increased heart rate, palpitations, insomnia, increased anxiety, increased irritability, or decreased appetite.  Instructed patient to contact office if experiencing any significant tolerability issues. His BP is not consistetnly high but is a little high today  Disc Vitamin D and B possibly also.  Option retry Clent Demark or others.  Off opiates for an extended period.  Hx feeling energized on them.  Disc behavioral management techniques to manage ADHD, structure, less distracting environment.  Hold psych meds for now.  FU prn   Meredith Staggers, MD, DFAPA   Please see After Visit Summary for patient specific instructions.  No future appointments.   No orders of the defined types were placed in this encounter.   -------------------------------

## 2024-05-08 ENCOUNTER — Other Ambulatory Visit: Payer: Self-pay

## 2024-05-08 DIAGNOSIS — F902 Attention-deficit hyperactivity disorder, combined type: Secondary | ICD-10-CM

## 2024-05-08 MED ORDER — AMPHETAMINE-DEXTROAMPHETAMINE 10 MG PO TABS
ORAL_TABLET | ORAL | 0 refills | Status: AC
Start: 2024-05-08 — End: ?

## 2024-05-08 MED ORDER — AMPHETAMINE-DEXTROAMPHET ER 20 MG PO CP24
20.0000 mg | ORAL_CAPSULE | Freq: Every day | ORAL | 0 refills | Status: DC
Start: 2024-05-08 — End: 2024-09-25

## 2024-06-15 ENCOUNTER — Other Ambulatory Visit: Payer: Self-pay

## 2024-06-15 ENCOUNTER — Telehealth: Payer: Self-pay | Admitting: Psychiatry

## 2024-06-15 DIAGNOSIS — F902 Attention-deficit hyperactivity disorder, combined type: Secondary | ICD-10-CM

## 2024-06-15 MED ORDER — AMPHETAMINE-DEXTROAMPHET ER 20 MG PO CP24
20.0000 mg | ORAL_CAPSULE | Freq: Every day | ORAL | 0 refills | Status: DC
Start: 2024-06-15 — End: 2024-09-25

## 2024-06-15 MED ORDER — AMPHETAMINE-DEXTROAMPHETAMINE 10 MG PO TABS
ORAL_TABLET | ORAL | 0 refills | Status: AC
Start: 2024-06-15 — End: ?

## 2024-06-15 NOTE — Telephone Encounter (Signed)
Pt has appt 9/29

## 2024-06-15 NOTE — Telephone Encounter (Signed)
 Pt lvm requesting Rx for Adderall XR 20 mg and Adderall IR 10 mg HT [place]. Apt PRN

## 2024-06-15 NOTE — Telephone Encounter (Signed)
 Please call pt to schedule FU. Last seen in March with FU PRN, but he is now on stimulants so needs FU. Not urgent

## 2024-06-15 NOTE — Telephone Encounter (Signed)
 Pended both doses to HT

## 2024-07-20 ENCOUNTER — Telehealth: Payer: Self-pay | Admitting: Psychiatry

## 2024-07-20 ENCOUNTER — Other Ambulatory Visit: Payer: Self-pay

## 2024-07-20 DIAGNOSIS — F902 Attention-deficit hyperactivity disorder, combined type: Secondary | ICD-10-CM

## 2024-07-20 MED ORDER — AMPHETAMINE-DEXTROAMPHETAMINE 10 MG PO TABS
ORAL_TABLET | ORAL | 0 refills | Status: AC
Start: 2024-08-17 — End: ?

## 2024-07-20 MED ORDER — AMPHETAMINE-DEXTROAMPHETAMINE 10 MG PO TABS
ORAL_TABLET | ORAL | 0 refills | Status: AC
Start: 2024-07-20 — End: ?

## 2024-07-20 MED ORDER — AMPHETAMINE-DEXTROAMPHET ER 20 MG PO CP24
20.0000 mg | ORAL_CAPSULE | Freq: Every day | ORAL | 0 refills | Status: DC
Start: 2024-07-20 — End: 2024-09-25

## 2024-07-20 MED ORDER — AMPHETAMINE-DEXTROAMPHET ER 20 MG PO CP24
20.0000 mg | ORAL_CAPSULE | Freq: Every day | ORAL | 0 refills | Status: AC
Start: 2024-08-17 — End: ?

## 2024-07-20 NOTE — Telephone Encounter (Signed)
 Pended. No documentation of an earlier request.

## 2024-07-20 NOTE — Telephone Encounter (Signed)
 Pt called again at 11:22a and stating again that he LVM on Monday before we opened and then someone called him back and he spoke to them.  He said he really needs it sent in today as he leaves to go out of town tomorrow morning.  He is asking if someone will call him back once it has been sent in.

## 2024-07-20 NOTE — Telephone Encounter (Signed)
 Pended to Dr. Geoffry at 9:20 AM. Waiting on approval.

## 2024-07-20 NOTE — Telephone Encounter (Signed)
 Pt lvm requesting Adderall 20 mg and 10 mg both IR  Rx to [facility]. Apt 9/29 Stated need to get today.Vacation tomorrow. Stated he called Monday requesting.

## 2024-09-03 ENCOUNTER — Encounter: Payer: Self-pay | Admitting: Psychiatry

## 2024-09-03 ENCOUNTER — Ambulatory Visit: Payer: Self-pay | Admitting: Psychiatry

## 2024-09-03 DIAGNOSIS — F411 Generalized anxiety disorder: Secondary | ICD-10-CM

## 2024-09-03 DIAGNOSIS — F902 Attention-deficit hyperactivity disorder, combined type: Secondary | ICD-10-CM

## 2024-09-03 NOTE — Progress Notes (Signed)
 Bruce Perez [id number] March 26, 1966 58 y.o.  Virtual Visit via Telephone Note  I connected with pt by telephone and verified that I am speaking with the correct person using two identifiers.   I discussed the limitations, risks, security and privacy concerns of performing an evaluation and management service by telephone and the availability of in person appointments. I also discussed with the patient that there may be a patient responsible charge related to this service. The patient expressed understanding and agreed to proceed.  I discussed the assessment and treatment plan with the patient. The patient was provided an opportunity to ask questions and all were answered. The patient agreed with the plan and demonstrated an understanding of the instructions.   The patient was advised to call back or seek an in-person evaluation if the symptoms worsen or if the condition fails to improve as anticipated.  I provided 15 minutes of non-face-to-face time during this encounter. The call started at [phone] The patient was located at work and the provider was located office.   Subjective:   Patient ID:  Bruce Perez is a 58 y.o. (DOB 11/04/1966) male.  Chief Complaint:  Chief Complaint  Patient presents with   Follow-up   ADD    Bruce Perez presents to the office today for follow-up of ADD and episodic mood problems.  seen July 09, 2019.  He was still having cognitive complaints.  He did not want to try another psych med but was interested in trying off label N-acetylcysteine possibly combined with a B complex vitamin for its cognitive potential benefit.  seen August 30, 2019.  Want to retry stimulant.  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  Low libido. Wants to test testosterone level. He wants to retry Adderall.  He took it only very briefly years ago during a period of very high stress and therefore is difficult to evaluate its  effects.  He is med sensitive so we will start with a low-dose 5 mg twice daily and increase to 10 mg twice daily.  We can go higher if that is ineffective.  He has received refills since that time.  02/2020 appointment with the following noted: Adderall helpful.  Immediately helped focus. Really good.  No SE problems.  Not more irritable nor edgy.  Mood good.  Going through breakup of 2 & 1/2 year relationship. Sleep is OK.  It lasts 6 hours. GF goes to integrative health and asks about alternative med supplements.   Some dizziness with the Strattera  and nausea.  Took it daily since here uhntil the last few days.  Still chronic stress.  Energy is better with ADD meds.  Only thing that makes me task focused is opiates and understands it's not an option Easily distracted, makes mistakes and it hurts business.  Can be irritable.   Chronically stressed. And easily anxious.   Has girlfriend who's into holistic meds.  Plan: Continue N-acetylcysteine and Adderall 10 mg twice daily with adequate response  01/13/2021 appointment with the following noted: Covid 07/2020 from GF both ended up on O2  Sick for 3 weeks. Feels his body has adjusted some and some tolerance with Adderall.  High levels energy tend to help his focus.  nOt much difference on days he takes it or not.   Mild SE dizziness .  Tried Adderall 15 and it tended to help a little more. Hydrocodone tends to help focus the most. Adderall lasts 6 hours.  09/27/22  appt noted: Testosterone replacement didn't solve energy problems but does have normal levels.  Feels he needs to stay on stimulant. Wants to resume Adderall  but maybe consider XR.  Can get benefit from up to 6 hours.  2 evenings per week works late.   Travelling to conference tomorrow.   Mood and anxiety are OK overall.  No irritability. Married a year ago. Met a great girl at [employer].   Health good.   Normal BP.  03/07/23 appt : Taking Adderall XR 20 AM and occ Adderall 10  mg. Didn't notice much effect from testosterone after supplementation. Getting good benefit with Adderall.  But still can make mistakes.    No sig SE.   No prolems with mood and anxiety. Appetite and sleep are good.  Did start snoring.   No SE with testosterone .  02/23/24 appt noted: Med: Johanna for a couple of weeks with benefit.  Testosterone 18 mos. Nasty virus this week.  Flu-like sx with severe vertigo. Thesis for a couple of weeks with benefit.  Pretty much same benefit as Adderall for energy.   Also didn't have crash as much as Adderall. Taking [employer] and it helps. No SE noted.    09/03/24 appt noted:  Med: Adderall ER 20 AM and Adderall IR 10 daily prn ( acouple of times weekly).  No SE In short found Thesis to be not as effective.  Will take some on weekends or working late. Energy benefit from Adderall and probably focus. No concerns with Adderall .   Health is great generally.   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   Endorsed with [employer].    No change indicated. BP is in normal range  Past Psychiatric Medication Trials:  Thesis for a couple of weeks with benefit. Wellbutrin, sertraline, fluoxetine, Trintellix,  zolpidem, buspirone, Propranolol, hydroxyzine,    Adderall, Vyvanse, Provigil side effects, Strattera  SE, Concerta, MPH,  Tried NAC and B complex.  No benefit for energy.  Review of Systems:  Review of Systems  Constitutional:  Negative for fatigue.  Musculoskeletal:  Positive for arthralgias. Negative for myalgias.  Neurological:  Negative for tremors.  Psychiatric/Behavioral:  The patient is not nervous/anxious.     Medications: I have reviewed the patient's current medications.  Current Outpatient Medications  Medication Sig Dispense Refill   Acetylcysteine (NAC) 500 MG CAPS Take by mouth.     amphetamine -dextroamphetamine  (ADDERALL XR) 20 MG 24 hr capsule Take 1 capsule (20 mg total) by mouth  daily. 30 capsule 0   amphetamine -dextroamphetamine  (ADDERALL XR) 20 MG 24 hr capsule Take 1 capsule (20 mg total) by mouth daily. 30 capsule 0   amphetamine -dextroamphetamine  (ADDERALL XR) 20 MG 24 hr capsule Take 1 capsule (20 mg total) by mouth daily. 30 capsule 0   amphetamine -dextroamphetamine  (ADDERALL XR) 20 MG 24 hr capsule Take 1 capsule (20 mg total) by mouth daily. 30 capsule 0   amphetamine -dextroamphetamine  (ADDERALL) 10 MG tablet 1 at 4PM  if needed for long work days. 30 tablet 0   amphetamine -dextroamphetamine  (ADDERALL) 10 MG tablet 1 at 4PM  if needed for long work days. 30 tablet 0   amphetamine -dextroamphetamine  (ADDERALL) 10 MG tablet 1 at 4PM  if needed for long work days. 30 tablet 0   amphetamine -dextroamphetamine  (ADDERALL) 10 MG tablet 1 at 4PM  if needed for long work days. 30 tablet 0   atorvastatin (LIPITOR) 40 MG tablet Take 40 mg by mouth daily.  cholecalciferol (VITAMIN D3) 25 MCG (1000 UT) tablet Take 1,000 Units by mouth daily.     magnesium 30 MG tablet Take 30 mg by mouth daily.     Omega-3 Fatty Acids (FISH OIL) 1000 MG CAPS Take by mouth.     TESTOSTERONE IM Inject into the muscle.     No current facility-administered medications for this visit.    Medication Side Effects: None  Allergies: No Known Allergies  History reviewed. No pertinent past medical history.  History reviewed. No pertinent family history.  Social History   Socioeconomic History   Marital status: Married    Spouse name: Not on file   Number of children: Not on file   Years of education: Not on file   Highest education level: Not on file  Occupational History   Not on file  Tobacco Use   Smoking status: Never   Smokeless tobacco: Former  Substance and Sexual Activity   Alcohol use: Yes   Drug use: No   Sexual activity: Not on file  Other Topics Concern   Not on file  Social History Narrative   Not on file   Social Drivers of Health   Financial Resource Strain:  Not on file  Food Insecurity: Not on file  Transportation Needs: Not on file  Physical Activity: Not on file  Stress: Not on file  Social Connections: Unknown (04/19/2022)   [facility]   Social Network    Social Network: Not on file  Intimate Partner Violence: Unknown (03/11/2022)   Received from [facility]   HITS    Physically Hurt: Not on file    Insult or Talk Down To: Not on file    Threaten Physical Harm: Not on file    Scream or Curse: Not on file    Past Medical History, Surgical history, Social history, and Family history were reviewed and updated as appropriate.   Please see review of systems for further details on the patient's review from today.   Objective:   Physical Exam:  There were no vitals taken for this visit.  Physical Exam Neurological:     Mental Status: He is alert and oriented to person, place, and time.     Cranial Nerves: No dysarthria.  Psychiatric:        Attention and Perception: He is attentive.        Mood and Affect: Mood normal. Mood is not anxious or depressed.        Speech: Speech normal.        Behavior: Behavior is cooperative.        Thought Content: Thought content normal. Thought content is not paranoid or delusional. Thought content does not include homicidal or suicidal ideation. Thought content does not include suicidal plan.        Cognition and Memory: Cognition and memory normal.        Judgment: Judgment normal.     Comments: Insight fair     Lab Review:     Component Value Date/Time   NA 137 08/13/2015 1833   K 3.8 08/13/2015 1833   CL 101 08/13/2015 1833   CO2 27 08/13/2015 1833   GLUCOSE 92 08/13/2015 1833   BUN 13 08/13/2015 1833   CREATININE 1.00 08/13/2015 1833   CALCIUM 9.3 08/13/2015 1833   PROT 6.9 08/13/2015 1833   ALBUMIN 4.0 08/13/2015 1833   AST 31 08/13/2015 1833   ALT 44 08/13/2015 1833   ALKPHOS 58 08/13/2015 1833  BILITOT 0.6 08/13/2015 1833   GFRNONAA >60 08/13/2015 1833    GFRAA >60 08/13/2015 1833       Component Value Date/Time   WBC 13.6 (H) 08/13/2015 1425   RBC 5.11 08/13/2015 1425   HGB 16.0 08/13/2015 1425   HCT 46.0 08/13/2015 1425   PLT 246 08/13/2015 1425   MCV 90.0 08/13/2015 1425   MCH 31.3 08/13/2015 1425   MCHC 34.8 08/13/2015 1425   RDW 12.6 08/13/2015 1425    No results found for: POCLITH, LITHIUM   No results found for: PHENYTOIN, PHENOBARB, VALPROATE, CBMZ   .res Assessment: Plan:    Susumu Chip was seen today for follow-up and add.  Diagnoses and all orders for this visit:  Generalized anxiety disorder  Attention deficit hyperactivity disorder (ADHD), combined type  Otherwise he is healthy.  Depression and anxiety are under good control.    Bc partial benefit of [employer] , he wants to continue this prn with Adderall.    Adderall XR 20 mg every morning, Adderall 10 mg every afternoon on long work days.  Good response XR vs IR  Discussed potential benefits, risks, and side effects of stimulants with patient to include increased heart rate, palpitations, insomnia, increased anxiety, increased irritability, or decreased appetite.  Instructed patient to contact office if experiencing any significant tolerability issues. His BP is not consistetnly high but is a little high today  Disc Vitamin D and B possibly also.  Option retry strattera  , [employer]  or others.  Off opiates for an extended period.  Hx feeling energized on them.  Disc behavioral management techniques to manage ADHD, structure, less distracting environment.  Hold psych meds for now.  FU 6  mos  Lorene Macintosh, MD, DFAPA   Please see After Visit Summary for patient specific instructions.  No future appointments.    No orders of the defined types were placed in this encounter.   -------------------------------

## 2024-09-25 ENCOUNTER — Other Ambulatory Visit: Payer: Self-pay

## 2024-09-25 ENCOUNTER — Telehealth: Payer: Self-pay | Admitting: Psychiatry

## 2024-09-25 DIAGNOSIS — F902 Attention-deficit hyperactivity disorder, combined type: Secondary | ICD-10-CM

## 2024-09-25 MED ORDER — AMPHETAMINE-DEXTROAMPHET ER 20 MG PO CP24
20.0000 mg | ORAL_CAPSULE | Freq: Every day | ORAL | 0 refills | Status: AC
Start: 2024-10-23 — End: ?

## 2024-09-25 MED ORDER — AMPHETAMINE-DEXTROAMPHET ER 20 MG PO CP24
20.0000 mg | ORAL_CAPSULE | Freq: Every day | ORAL | 0 refills | Status: AC
Start: 2024-09-25 — End: ?

## 2024-09-25 MED ORDER — AMPHETAMINE-DEXTROAMPHET ER 20 MG PO CP24
20.0000 mg | ORAL_CAPSULE | Freq: Every day | ORAL | 0 refills | Status: AC
Start: 2024-11-20 — End: ?

## 2024-09-25 NOTE — Telephone Encounter (Signed)
 Pt needs rf on Adderall XR     [employer] [address]  [place] [address]

## 2024-09-25 NOTE — Telephone Encounter (Signed)
 Pended

## 2024-12-13 ENCOUNTER — Telehealth: Payer: Self-pay | Admitting: Psychiatry

## 2024-12-13 NOTE — Telephone Encounter (Signed)
 Pt should already have Rx on file to fill. Will contact pharmacy to make sure.

## 2024-12-13 NOTE — Telephone Encounter (Signed)
 LM for pt to call back and let us  know if he wants to try a different pharmacy

## 2024-12-13 NOTE — Telephone Encounter (Signed)
 Pharmacy does have 1 Rx on file already but it's on back order. Will let pt know and see what he would like to do.

## 2024-12-13 NOTE — Telephone Encounter (Signed)
 Bruce Perez lvm that he needs a refill on his adderall xr 20 mg. Pharmacy is [facility] [address]  in [place]

## 2024-12-17 NOTE — Telephone Encounter (Signed)
 LVM to [facility]

## 2024-12-18 NOTE — Telephone Encounter (Signed)
 Sent MyChart message asking him to call.

## 2025-02-18 ENCOUNTER — Telehealth: Payer: Self-pay | Admitting: Psychiatry
